# Patient Record
Sex: Female | Born: 1945 | Race: Black or African American | Hispanic: No | Marital: Single | Smoking: Current some day smoker
Health system: Southern US, Community
[De-identification: ages and names within clinical notes are randomized; demographics above are authoritative.]

## PROBLEM LIST (undated history)

## (undated) DIAGNOSIS — R569 Unspecified convulsions: Secondary | ICD-10-CM

## (undated) DIAGNOSIS — I10 Essential (primary) hypertension: Secondary | ICD-10-CM

---

## 2015-01-26 ENCOUNTER — Encounter (HOSPITAL_COMMUNITY): Payer: Self-pay | Admitting: Intensive Care

## 2015-01-26 ENCOUNTER — Inpatient Hospital Stay (HOSPITAL_COMMUNITY)
Admission: AD | Admit: 2015-01-26 | Discharge: 2015-02-01 | DRG: 101 | Disposition: A | Discharge status: Home Health | Payer: Medicare Other | Source: Other Acute Inpatient Hospital | Attending: Internal Medicine | Admitting: Internal Medicine

## 2015-01-26 DIAGNOSIS — R4182 Altered mental status, unspecified: Secondary | ICD-10-CM | POA: Diagnosis not present

## 2015-01-26 DIAGNOSIS — F17200 Nicotine dependence, unspecified, uncomplicated: Secondary | ICD-10-CM | POA: Diagnosis present

## 2015-01-26 DIAGNOSIS — F0390 Unspecified dementia without behavioral disturbance: Secondary | ICD-10-CM | POA: Diagnosis present

## 2015-01-26 DIAGNOSIS — F09 Unspecified mental disorder due to known physiological condition: Secondary | ICD-10-CM | POA: Diagnosis not present

## 2015-01-26 DIAGNOSIS — R9431 Abnormal electrocardiogram [ECG] [EKG]: Secondary | ICD-10-CM | POA: Diagnosis present

## 2015-01-26 DIAGNOSIS — R7989 Other specified abnormal findings of blood chemistry: Secondary | ICD-10-CM

## 2015-01-26 DIAGNOSIS — R410 Disorientation, unspecified: Secondary | ICD-10-CM | POA: Diagnosis present

## 2015-01-26 DIAGNOSIS — I10 Essential (primary) hypertension: Secondary | ICD-10-CM | POA: Diagnosis present

## 2015-01-26 DIAGNOSIS — G40909 Epilepsy, unspecified, not intractable, without status epilepticus: Principal | ICD-10-CM | POA: Diagnosis present

## 2015-01-26 DIAGNOSIS — R451 Restlessness and agitation: Secondary | ICD-10-CM | POA: Diagnosis not present

## 2015-01-26 DIAGNOSIS — R748 Abnormal levels of other serum enzymes: Secondary | ICD-10-CM | POA: Diagnosis present

## 2015-01-26 DIAGNOSIS — R569 Unspecified convulsions: Secondary | ICD-10-CM | POA: Diagnosis present

## 2015-01-26 DIAGNOSIS — F4322 Adjustment disorder with anxiety: Secondary | ICD-10-CM | POA: Diagnosis not present

## 2015-01-26 DIAGNOSIS — R079 Chest pain, unspecified: Secondary | ICD-10-CM | POA: Diagnosis not present

## 2015-01-26 DIAGNOSIS — R778 Other specified abnormalities of plasma proteins: Secondary | ICD-10-CM

## 2015-01-26 HISTORY — DX: Essential (primary) hypertension: I10

## 2015-01-26 HISTORY — DX: Unspecified convulsions: R56.9

## 2015-01-26 LAB — CBC
HCT: 29.6 % — ABNORMAL LOW (ref 36.0–46.0)
HEMOGLOBIN: 9.8 g/dL — AB (ref 12.0–15.0)
MCH: 31.5 pg (ref 26.0–34.0)
MCHC: 33.1 g/dL (ref 30.0–36.0)
MCV: 95.2 fL (ref 78.0–100.0)
Platelets: 154 10*3/uL (ref 150–400)
RBC: 3.11 MIL/uL — AB (ref 3.87–5.11)
RDW: 12.8 % (ref 11.5–15.5)
WBC: 8 10*3/uL (ref 4.0–10.5)

## 2015-01-26 LAB — TROPONIN I
TROPONIN I: 0.1 ng/mL — AB (ref <0.031)
TROPONIN I: 0.08 ng/mL — AB (ref <0.031)

## 2015-01-26 LAB — CREATININE, SERUM
Creatinine, Ser: 0.97 mg/dL (ref 0.44–1.00)
GFR calc non Af Amer: 57 mL/min — ABNORMAL LOW (ref >60)

## 2015-01-26 LAB — MRSA PCR SCREENING: MRSA by PCR: NEGATIVE

## 2015-01-26 MED ORDER — ACETAMINOPHEN 325 MG PO TABS: 650.0000 mg | ORAL_TABLET | Freq: Four times a day (QID) | ORAL | Status: DC | PRN

## 2015-01-26 MED ORDER — ENOXAPARIN SODIUM 40 MG/0.4ML ~~LOC~~ SOLN
40.0000 mg | SUBCUTANEOUS | Status: DC
  Administered 2015-01-26 – 2015-02-01 (×7): 40 mg via SUBCUTANEOUS
  Filled 2015-01-26 (×7): qty 0.4

## 2015-01-26 MED ORDER — LORAZEPAM 2 MG/ML IJ SOLN
INTRAMUSCULAR | Status: AC
  Administered 2015-01-26: 2 mg via INTRAVENOUS
  Filled 2015-01-26: qty 1

## 2015-01-26 MED ORDER — ACETAMINOPHEN 650 MG RE SUPP: 650.0000 mg | Freq: Four times a day (QID) | RECTAL | Status: DC | PRN

## 2015-01-26 MED ORDER — LORAZEPAM 2 MG/ML IJ SOLN
2.0000 mg | INTRAMUSCULAR | Status: DC | PRN
  Administered 2015-01-27: 2 mg via INTRAVENOUS
  Filled 2015-01-26: qty 1

## 2015-01-26 MED ORDER — ONDANSETRON HCL 4 MG PO TABS
4.0000 mg | ORAL_TABLET | Freq: Four times a day (QID) | ORAL | Status: DC | PRN
Start: 2015-01-26 — End: 2015-02-01

## 2015-01-26 MED ORDER — LORAZEPAM 2 MG/ML IJ SOLN
2.0000 mg | Freq: Once | INTRAMUSCULAR | Status: AC
  Administered 2015-01-26: 2 mg via INTRAVENOUS

## 2015-01-26 MED ORDER — ONDANSETRON HCL 4 MG/2ML IJ SOLN: 4.0000 mg | Freq: Four times a day (QID) | INTRAMUSCULAR | Status: DC | PRN

## 2015-01-26 MED ORDER — CARBAMAZEPINE 200 MG PO TABS
400.0000 mg | ORAL_TABLET | Freq: Three times a day (TID) | ORAL | Status: DC
  Administered 2015-01-26 – 2015-01-28 (×5): 400 mg via ORAL
  Filled 2015-01-26 (×12): qty 2

## 2015-01-26 NOTE — H&P (Signed)
[facility] History and Physical  [place] Searles WUJ:[id number] DOB: 03/22/1945 DOA: 01/26/2015  Referring physician: Emergency Department PCP: No primary care provider on file.   CHIEF COMPLAINT:   seizures  , elevated troponin   HPI: Gwenyth Dingee is a 69 y.o. female  With a history of HTN and seixures transferred from [facility] where patient presented last night with confusion and seizure activity. Limited history was able to be obtained. Troponin found to be elevated, patient transferred to [facility] for further evaluation. Upon arrival to stepdown patient had a seizure, she is now not responding to my questions but is moving around in bed. Supposedly on Carbamazepine at home       ED COURSE:     [facility] labs:  Normal Na, K+ and Ca+.  Glucose 90.  Mg 1.8. CK 1181, Troponin 0.09.  WBC 9.7, hgb 10.5 UDS + for Benzos.    Urinalysis:  Negative   EKG:  Non-specific mild ST changes  And twave inversion  Head CT scan WNL per records from [facility]                   Medications  LORazepam (ATIVAN) injection 2 mg (2 mg Intravenous Given 01/26/15 1042)    Review of Systems  Unable to perform ROS: medical condition    PMH: unable to verify with patient but HTN and seizures per records PSH: unable to verify  SOCIAL HISTORY:  has no tobacco, alcohol, and drug history on file. Lives: unknown   Assistive devices:   unknown   Allergies not on file Unable to verify with patient.   No family history on file. Unable to verify with patient  Prior to Admission medications   Not on File  Per [facility] ED notes she is on: .  Carbamazepine  BID  Macrodantin  BID  Zestoretic 20-12.5 daily    PHYSICAL EXAM: Filed Vitals:   01/26/15 1038 01/26/15 1040 01/26/15 1045 01/26/15 1100  BP:    112/50  Pulse: 120 103 90 82  Resp:  Height:      Weight:      SpO2: 97% 98% 98% 100%    Wt Readings from Last 3 Encounters:    01/26/15 50.4 kg (111 lb 1.8 oz)    General:  Thin black female, lethargic in bed Eyes: PER, normal lids, irises & conjunctiva ENT: grossly normal hearing, lips  Neck: no LAD, no masses Cardiovascular: RRR, no murmurs.  Respiratory: Respirations even and unlabored. Normal respiratory effort. Difficult to ascultate, she can't participate in exam. No obvious wheezes.   Abdomen: soft, non-distended, non-tender, active bowel sounds. No obvious masses.  Skin: no rash seen on limited exam Musculoskeletal: grossly normal tone BUE/BLE Psychiatric: grossly normal mood and affect, speech fluent and appropriate Neurologic: moves all extremities. Doesn't follow commands. Opens eyes briefly to sternal rub..          ASSESSMENT / PLAN   Seizures. Apparently has history of seizures and is maintained on Tegretol.  -Admit to stepdown -Head CTscan negative per report. Will give disc to Radiology to read.  -Neurology called- Dr .Roseanne Reno to see. Until seen by Neurology will give Ativan as needed. - Start Tegretol 400 milligrams by mouth 3 times a day -Tegretol level - there was question about whether patient was taking meds.  -seizure precautions  Elevated Troponin, probably related to seizure activity but she has Twave inversions on ECG. .  -Will ask Cardiology  to see -cycle Troponins.  Hypertension.  -Restart home Zestoretic when awake.   CONSULTANTS:    Neurology  Cardiology  Code Status: Full code per transfer papers DVT Prophylaxis: Lovenox Family Communication:  Will try to reach family member to discuss.  Disposition Plan: Discharge to home in 2-3 days   Time spent: 60 minutes Willette ClusterPaula Guenther  NP [facility] Pager [phone] I have taken an interval history, reviewed the chart and examined the patient. I agree with the Advanced Practice Provider's note, impression and recommendations. I have made any necessary editorial changes. 69 yr old female with history of seizures  brought to the hospital with seizures at home. Patient given Ativan as she had one seizure in the step down unit. Also has mild elevation of troponin. Started Ativan 2 mg IV every 4 hours when necessary. Also restart all medications Tegretol 400 mg by mouth 3 times a day. Obtain Tegretol level. Neurology and cardio  consultation.

## 2015-01-26 NOTE — Consult Note (Signed)
Admission H&P    Chief Complaint: Recurrent generalized seizure.  HPI: Victoria Hubbard is an 69 y.o. female with a history of seizure disorder on Tegretol seen in emergency room at [facility] for altered mental status with confusion. He reportedly had a generalized seizure as well. Troponin was noted to be elevated and patient was transferred to [facility] for further management. Tegretol level is ending. It is unclear and she last experienced seizure activity. She has been confused and agitated. She was given Versed early and is currently sedated.  Past Medical History  Diagnosis Date  . Seizures ([place])     No past surgical history on file.  Family history: Unavailable due to patient's mental status . Social History:  Unavailable due to patient's mental status.  Allergies: Allergies not on file  Medications: Patient reportedly was taking Tegretol for seizure control  ROS: Unavailable due to patient's mental status.  Physical Examination: Blood pressure 115/55, pulse 73, temperature 99 F (37.2 C), temperature source Axillary, resp. rate 15, height 5' 2"  (1.575 m), weight 50.4 kg (111 lb 1.8 oz), SpO2 100 %.  HEENT-  Normocephalic, no lesions, without obvious abnormality.  Normal external eye and conjunctiva.  Normal TM's bilaterally.  Normal auditory canals and external ears. Normal external nose, mucus membranes and septum.  Normal pharynx. Neck supple with no masses, nodes, nodules or enlargement. Cardiovascular - regular rate and rhythm, S1, S2 normal, no murmur, click, rub or gallop Lungs - chest clear, no wheezing, rales, normal symmetric air entry Abdomen - soft, non-tender; bowel sounds normal; no masses,  no organomegaly Extremities - no joint deformities, effusion, or inflammation and no edema  Neurologic Examination: Patient was markedly obtunded and agitated when stimulated. She appear to be in no acute distress. Pupils were equal and reacted normally to  light. Extraocular movements were normal and conjugate with oculocephalic maneuvers. Face was symmetrical with no focal weakness. Muscle tone was flaccid throughout. Withdrawal movements to noxious stimuli were equal and symmetrical. Deep tendon reflexes were 2+ and symmetrical. Plantar responses were flexor bilaterally.  Results for orders placed or performed during the hospital encounter of 01/26/15 (from the past 48 hour(s))  MRSA PCR Screening     Status: None   Collection Time: 01/26/15 10:40 AM  Result Value Ref Range   MRSA by PCR NEGATIVE NEGATIVE    Comment:        The GeneXpert MRSA Assay (FDA approved for NASAL specimens only), is one component of a comprehensive MRSA colonization surveillance program. It is not intended to diagnose MRSA infection nor to guide or monitor treatment for MRSA infections.   CBC     Status: Abnormal   Collection Time: 01/26/15 12:39 PM  Result Value Ref Range   WBC 8.0 4.0 - 10.5 K/uL   RBC 3.11 (L) 3.87 - 5.11 MIL/uL   Hemoglobin 9.8 (L) 12.0 - 15.0 g/dL   HCT 29.6 (L) 36.0 - 46.0 %   MCV 95.2 78.0 - 100.0 fL   MCH 31.5 26.0 - 34.0 pg   MCHC 33.1 30.0 - 36.0 g/dL   RDW 12.8 11.5 - 15.5 %   Platelets 154 150 - 400 K/uL  Creatinine, serum     Status: Abnormal   Collection Time: 01/26/15 12:39 PM  Result Value Ref Range   Creatinine, Ser 0.97 0.44 - 1.00 mg/dL   GFR calc non Af Amer 57 (L) >60 mL/min   GFR calc Af Amer >60 >60 mL/min    Comment: (NOTE)  The eGFR has been calculated using the CKD EPI equation. This calculation has not been validated in all clinical situations. eGFR's persistently <60 mL/min signify possible Chronic Kidney Disease.   Troponin I     Status: Abnormal   Collection Time: 01/26/15 12:39 PM  Result Value Ref Range   Troponin I 0.10 (H) <0.031 ng/mL    Comment:        PERSISTENTLY INCREASED TROPONIN VALUES IN THE RANGE OF 0.04-0.49 ng/mL CAN BE SEEN IN:       -UNSTABLE ANGINA       -CONGESTIVE HEART  FAILURE       -MYOCARDITIS       -CHEST TRAUMA       -ARRYHTHMIAS       -LATE PRESENTING MYOCARDIAL INFARCTION       -COPD   CLINICAL FOLLOW-UP RECOMMENDED.    No results found.  Assessment/Plan Evident 69-year-old lady with a history of seizure disorder treated with Tegretol presenting with current generalized seizure.  Recommendations: 1. Presenting Tegretol at 400 mg 3 times a day. 2. MRI of the brain to rule out possible acute stroke. 3. No indication for EEG study.  We will continue to follow this patient with you.  C.R. Nicole Kindred, MD [facility] [phone]  01/26/2015, 3:14 PM

## 2015-01-26 NOTE — Progress Notes (Signed)
Transfer from [facility] per EDP, Dr. Darius BumpMckinless  69 year old lady is past medical history of seizure on carbamazepine, who presents with the altered mental status with unknow starting time. No focal neurologic findings. Patient was found to have elevated troponin 0.09, CK-MB 7.9-->9.3. Patient does not have chest pain, shortness of breath, fever or chills per EDP. Patient is agitated in the emergency room and treated with Versed. CT head is negative, chest x-ray negative, RBC 9.7, hemoglobin 10.3, temperature 37.2, blood pressure 126/86, heart rate 78, RR 18, electrolytes and renal function normal. EKG showed QTC 433, T wave flattening in lead 1 and aVL. Due to lack of cardiology coverage, patient is transferred to us (card not called yet). One dose of ASA was given.   Lorretta HarpXilin Syesha Thaw, MD  [facility] Pager [phone]  If 7PM-7AM, please contact night-coverage [email or web address] Password [email or web address] 01/26/2015, 6:29 AM

## 2015-01-26 NOTE — Consult Note (Signed)
CARDIOLOGY CONSULT NOTE  Patient ID: Victoria FarberJudy Hubbard MRN: [id number] DOB/AGE: 1945/09/16 69 y.o.  Admit date: 01/26/2015 Primary Physician Unknown Primary Cardiologist Unknown Chief Complaint  Elevated troponin.   HPI:  The patient was transferred from [facility] after being admitted there for seizures.  She was noted to have an elevated troponin.   Since being admitted she has had recurrent seizures.   She is now post ictal and status post Versed.  She is unable to provide any history.   She has no family contacts.  There is nobody here with her to provide a history.  There are minimal records from the transferring hospital.   No records in Care Everywhere.  She reported a history of seizures.  There was some question of whether she was taking her medications.     Past Medical History  Diagnosis Date  . Seizures (HCC)     Past Surgical Hysterectomy   Allergies:  Outside hospital reports no allergies but we cannot confirm from the patient.   Meds:  Carbatrol 600 mg po BID,  Zestoretic 20/12.5 BID, Macrodantin 100 mg BID  No family history on file.  Social History   Social History  . Marital Status: Single    Spouse Name: N/A  . Number of Children: N/A  . Years of Education: N/A   Occupational History  . Not on file.   Social History Main Topics  . Smoking status: Not on file  . Smokeless tobacco: Not on file  . Alcohol Use: Not on file  . Drug Use: Not on file  . Sexual Activity: Not on file   Other Topics Concern  . Not on file   Social History Narrative  . No narrative on file     ROS:   Unable to obtain from the patient as she is not responding to questions.   Physical Exam: Blood pressure 118/59, pulse 77, temperature 99 F (37.2 C), temperature source Axillary, resp. rate 15, height 5\' 2"  (1.575 m), weight 111 lb 1.8 oz (50.4 kg), SpO2 97 %.  GENERAL:  Thin, and in no distress NECK:  No jugular venous distention, waveform within normal  limits, carotid upstroke brisk and symmetric, no bruits, no thyromegaly LYMPHATICS:  No cervical, inguinal adenopathy LUNGS:  Clear to auscultation bilaterally BACK:  No CVA tenderness CHEST:  Unremarkable HEART:  PMI not displaced or sustained,S1 and S2 within normal limits, no S3, no S4, no clicks, no rubs, no murmurs ABD:  Flat, positive bowel sounds normal in frequency in pitch, no bruits, no rebound, no guarding, no midline pulsatile mass, no hepatomegaly, no splenomegaly EXT:  2 plus pulses throughout, no edema, no cyanosis no clubbing SKIN:  No rashes no nodules NEURO:  Moves all extremities.  PSYCH:   Unable to assess   Labs: Troponin:  .09,   CK 1229, MB 9.3, Na 142, K 4, BUN 21, creat 1.1, WBC 9.7,  Hgb 10.5,      EKG:  NSR, rate 78, axis WNL, intervals WNL, no acute ST T wave changes.  RSR V1 and V2.  No acute ST T wave changes.   ASSESSMENT AND PLAN:   Elevated troponin:  No evidence of an acute cardiac event.  Single mildly elevated troponin is non specific.  Cycle enzymes.  We will need to question her when able to see if she has any past cardiac history or recent cardiac symptoms.  No urgent work up indicated.  No indication for heparin.  Outside  records reviewed.     SignedRollene Rotunda 01/26/2015, 12:26 PM

## 2015-01-26 NOTE — Progress Notes (Signed)
01/26/2015 1100 Nursing note Seizure pads placed per protocol.  Morrill Bomkamp, Blanchard KelchStephanie Ingold

## 2015-01-26 NOTE — Progress Notes (Signed)
01/26/2015 1038 Pt. Noted to be actively seizing at this time. Airway preserved, o2 sats  >90%. Placed on 2L [place]. Suction at bedside.  Admitting provider paged and made aware. Dr. Craige CottaSood with CCM on floor and verbal orders received for 2MG  Ativan IV x1. Orders enacted. Seizure activity lasted approximately 2 minutes. Postictally, pt. Responding to voice, vital signs stable. Will continue to closely monitor patient.  Victoria Hubbard, Blanchard KelchStephanie Hubbard

## 2015-01-27 LAB — CBC
HCT: 34 % — ABNORMAL LOW (ref 36.0–46.0)
Hemoglobin: 11 g/dL — ABNORMAL LOW (ref 12.0–15.0)
MCH: 30.8 pg (ref 26.0–34.0)
MCHC: 32.4 g/dL (ref 30.0–36.0)
MCV: 95.2 fL (ref 78.0–100.0)
PLATELETS: 176 10*3/uL (ref 150–400)
RBC: 3.57 MIL/uL — ABNORMAL LOW (ref 3.87–5.11)
RDW: 12.7 % (ref 11.5–15.5)
WBC: 7.6 10*3/uL (ref 4.0–10.5)

## 2015-01-27 LAB — COMPREHENSIVE METABOLIC PANEL
ALT: 18 U/L (ref 14–54)
ANION GAP: 9 (ref 5–15)
AST: 42 U/L — ABNORMAL HIGH (ref 15–41)
Albumin: 3 g/dL — ABNORMAL LOW (ref 3.5–5.0)
Alkaline Phosphatase: 70 U/L (ref 38–126)
BUN: 15 mg/dL (ref 6–20)
CHLORIDE: 111 mmol/L (ref 101–111)
CO2: 22 mmol/L (ref 22–32)
CREATININE: 1.1 mg/dL — AB (ref 0.44–1.00)
Calcium: 9.9 mg/dL (ref 8.9–10.3)
GFR, EST AFRICAN AMERICAN: 57 mL/min — AB (ref >60)
GFR, EST NON AFRICAN AMERICAN: 49 mL/min — AB (ref >60)
Glucose, Bld: 61 mg/dL — ABNORMAL LOW (ref 65–99)
POTASSIUM: 3.9 mmol/L (ref 3.5–5.1)
Sodium: 142 mmol/L (ref 135–145)
Total Bilirubin: 0.7 mg/dL (ref 0.3–1.2)
Total Protein: 6.1 g/dL — ABNORMAL LOW (ref 6.5–8.1)

## 2015-01-27 LAB — CK: CK TOTAL: 1447 U/L — AB (ref 38–234)

## 2015-01-27 LAB — TROPONIN I: Troponin I: 0.07 ng/mL — ABNORMAL HIGH (ref <0.031)

## 2015-01-27 NOTE — Progress Notes (Signed)
PROGRESS NOTE  Victoria Hubbard ZOX:[id number] DOB: 05-Sep-1945 DOA: 01/26/2015 PCP: No primary care provider on file.  Assessment/Plan: Seizures. Apparently has history of seizures and is maintained on Tegretol.  -Head CT scan negative per report -Neurology following: Tegretol 400 milligrams by mouth 3 times a day -Tegretol level - there was question about whether patient was taking meds? -seizure precautions  Elevated Troponin, probably related to seizure activity but she has T wave inversions on ECG. .  -no cardiology intervention -cycle Troponins.  Hypertension.  -Restart home Zestoretic when awake.   Called # listed and was not a valid number -also called PCP to get baseline-- patient is brought to appointments by "friend/neighbor", no family that PCP is aware of.  Patient is soft spoken and is "mentally challenged"-- social work consult PT eval ordered  Code Status: full Family Communication: patient Disposition Plan:    Consultants:  Neuro  cards  Procedures:     HPI/Subjective: No SOB, no CP  Objective: Filed Vitals:   01/27/15 0318 01/27/15 0730  BP: 144/59   Pulse: 76   Temp: 98.4 F (36.9 C) 98.7 F (37.1 C)  Resp: 14     Intake/Output Summary (Last 24 hours) at 01/27/15 1237 Last data filed at 01/27/15 0400  Gross per 24 hour  Intake      0 ml  Output    500 ml  Net   -500 ml   Filed Weights   01/26/15 1023  Weight: 50.4 kg (111 lb 1.8 oz)    Exam:   General:  Awake, soft spoken  Cardiovascular: rrr  Respiratory: clear  Abdomen: +BS, soft  Musculoskeletal: no edema   Data Reviewed: Basic Metabolic Panel:  Recent Labs Lab 01/26/15 1239 01/27/15 0645  NA  --  142  K  --  3.9  CL  --  111  CO2  --  22  GLUCOSE  --  61*  BUN  --  15  CREATININE 0.97 1.10*  CALCIUM  --  9.9   Liver Function Tests:  Recent Labs Lab 01/27/15 0645  AST 42*  ALT 18  ALKPHOS 70  BILITOT 0.7  PROT 6.1*  ALBUMIN 3.0*   No results  for input(s): LIPASE, AMYLASE in the last 168 hours. No results for input(s): AMMONIA in the last 168 hours. CBC:  Recent Labs Lab 01/26/15 1239 01/27/15 0645  WBC 8.0 7.6  HGB 9.8* 11.0*  HCT 29.6* 34.0*  MCV 95.2 95.2  PLT 154 176   Cardiac Enzymes:  Recent Labs Lab 01/26/15 1239 01/26/15 1916 01/27/15  TROPONINI 0.10* 0.08* 0.07*   BNP (last 3 results) No results for input(s): BNP in the last 8760 hours.  ProBNP (last 3 results) No results for input(s): PROBNP in the last 8760 hours.  CBG: No results for input(s): GLUCAP in the last 168 hours.  Recent Results (from the past 240 hour(s))  MRSA PCR Screening     Status: None   Collection Time: 01/26/15 10:40 AM  Result Value Ref Range Status   MRSA by PCR NEGATIVE NEGATIVE Final    Comment:        The GeneXpert MRSA Assay (FDA approved for NASAL specimens only), is one component of a comprehensive MRSA colonization surveillance program. It is not intended to diagnose MRSA infection nor to guide or monitor treatment for MRSA infections.      Studies: No results found.  Scheduled Meds: . carbamazepine  400 mg Oral TID  . enoxaparin (LOVENOX) injection  40 mg Subcutaneous Q24H   Continuous Infusions:  Antibiotics Given (last 72 hours)    None      Principal Problem:   Seizures (HCC) Active Problems:   Essential hypertension   Nonspecific abnormal electrocardiogram (ECG) (EKG)   Elevated troponin level    Time spent: 25 min    Carnisha Feltz [facility]  [facility] Pager [phone]. If 7PM-7AM, please contact night-coverage at [email or web address], password [facility] 01/27/2015, 12:37 PM  LOS: 1 day

## 2015-01-27 NOTE — Progress Notes (Signed)
Pt continues to pull herself off of the monitor. VSS. Monitor removed to assist decrease in patient restlessness. RN will continue to monitor,

## 2015-01-27 NOTE — Evaluation (Signed)
Clinical/Bedside Swallow Evaluation Patient Details  Name: Mickey FarberJudy Streicher MRN: [id number] Date of Birth: [birth year]  Today's Date: 01/27/2015 Time: SLP Start Time (ACUTE ONLY): 1036 SLP Stop Time (ACUTE ONLY): 1049 SLP Time Calculation (min) (ACUTE ONLY): 13 min  Past Medical History:  Past Medical History  Diagnosis Date  . Seizures (HCC)    Past Surgical History: No past surgical history on file. HPI:  69 year old female transferred from outside hospital with confusion and seizure activity. PMH of seizures, HTN. Per Notes, CT of the head negative. MRI pending to r/o CVA.    Assessment / Plan / Recommendation Clinical Impression  Patient presents with a functional oropharyngeal swallow without observed evidence of dysphagia or aspiration. No SLP f/u indicated at this time.     Aspiration Risk  Mild aspiration risk    Diet Recommendation Regular;Thin liquid   Liquid Administration via: Cup;Straw Medication Administration: Whole meds with liquid Supervision: Patient able to self feed Compensations: Slow rate;Small sips/bites Postural Changes: Seated upright at 90 degrees    Other  Recommendations Oral Care Recommendations: Oral care BID   Follow up Recommendations  None               Swallow Study   General HPI: 69 year old female transferred from outside hospital with confusion and seizure activity. PMH of seizures, HTN. Per Notes, CT of the head negative. MRI pending to r/o CVA.  Type of Study: Bedside Swallow Evaluation Previous Swallow Assessment: none noted Diet Prior to this Study: NPO Temperature Spikes Noted: No Respiratory Status: Room air History of Recent Intubation: No Behavior/Cognition: Alert;Cooperative;Pleasant mood Oral Cavity Assessment: Within Functional Limits Oral Care Completed by SLP: No Oral Cavity - Dentition: Missing dentition Vision: Functional for self-feeding Self-Feeding Abilities: Able to feed self Patient Positioning: Upright in  bed Baseline Vocal Quality: Low vocal intensity (mumbles) Volitional Cough: Strong Volitional Swallow: Able to elicit    Oral/Motor/Sensory Function Overall Oral Motor/Sensory Function: Mild impairment Facial ROM: Within Functional Limits Facial Symmetry: Within Functional Limits Facial Strength: Within Functional Limits Facial Sensation: Within Functional Limits Lingual ROM: Within Functional Limits Lingual Symmetry: Abnormal symmetry right (subtle deviation to the right) Lingual Strength: Within Functional Limits Lingual Sensation: Within Functional Limits Velum: Within Functional Limits Mandible: Within Functional Limits   Ice Chips Ice chips: Not tested   Thin Liquid Thin Liquid: Within functional limits Presentation: Cup;Self Fed;Straw    Nectar Thick Nectar Thick Liquid: Not tested   Honey Thick Honey Thick Liquid: Not tested   Puree Puree: Within functional limits Presentation: Self Fed;Spoon   Solid   GO    Solid: Within functional limits Presentation: Self Fed      Amylee Lodato MA, CCC-SLP [phone]  Rajeev Escue Meryl 01/27/2015,10:50 AM

## 2015-01-27 NOTE — Progress Notes (Signed)
Subjective: Patient is alert and has not experienced recurrent seizure. She has remained confused and agitated. Tegretol level from 01/26/2015 still pending.  Objective: Current vital signs: BP 144/59 mmHg  Pulse 76  Temp(Src) 98.7 F (37.1 C) (Oral)  Resp 14  Ht 5\' 2"  (1.575 m)  Wt 50.4 kg (111 lb 1.8 oz)  BMI 20.32 kg/m2  SpO2 99%  Neurologic Exam: Patient was alert and disoriented to time as well as place. Extraocular movements were full and conjugate. Face was symmetrical. Speech was normal, without dysarthria. Patient moved extremities equally with good strength throughout.  Medications: I have reviewed the patient's current medications.  Assessment/Plan: 69 year old lady admitted with altered mental status as well as generalized seizure, along with chest pain. She has not had a recurrence of seizures. She's currently taking Tegretol 400 mg 3 times a day. She remains confused. It's unclear at this point for her baseline cognitive functioning is like. Ex  Recommend no changes in current management. We will continue to follow this patient with you.  C.R. Roseanne RenoStewart, MD [facility] [phone]  01/27/2015  10:45 AM

## 2015-01-28 ENCOUNTER — Encounter (HOSPITAL_COMMUNITY): Payer: Self-pay | Admitting: *Deleted

## 2015-01-28 DIAGNOSIS — R4182 Altered mental status, unspecified: Secondary | ICD-10-CM | POA: Insufficient documentation

## 2015-01-28 LAB — BASIC METABOLIC PANEL
Anion gap: 10 (ref 5–15)
BUN: 10 mg/dL (ref 6–20)
CHLORIDE: 109 mmol/L (ref 101–111)
CO2: 23 mmol/L (ref 22–32)
CREATININE: 0.9 mg/dL (ref 0.44–1.00)
Calcium: 9.6 mg/dL (ref 8.9–10.3)
GFR calc Af Amer: >60 mL/min (ref >60)
GFR calc non Af Amer: >60 mL/min (ref >60)
Glucose, Bld: 100 mg/dL — ABNORMAL HIGH (ref 65–99)
POTASSIUM: 3.6 mmol/L (ref 3.5–5.1)
Sodium: 142 mmol/L (ref 135–145)

## 2015-01-28 LAB — CARBAMAZEPINE LEVEL, TOTAL: Carbamazepine Lvl: 14.5 ug/mL — ABNORMAL HIGH (ref 4.0–12.0)

## 2015-01-28 LAB — CBC
HEMATOCRIT: 32 % — AB (ref 36.0–46.0)
Hemoglobin: 10.5 g/dL — ABNORMAL LOW (ref 12.0–15.0)
MCH: 30.8 pg (ref 26.0–34.0)
MCHC: 32.8 g/dL (ref 30.0–36.0)
MCV: 93.8 fL (ref 78.0–100.0)
Platelets: 166 10*3/uL (ref 150–400)
RBC: 3.41 MIL/uL — ABNORMAL LOW (ref 3.87–5.11)
RDW: 12.5 % (ref 11.5–15.5)
WBC: 7.2 10*3/uL (ref 4.0–10.5)

## 2015-01-28 MED ORDER — SODIUM CHLORIDE 0.9 % IV SOLN
INTRAVENOUS | Status: AC
  Administered 2015-01-28: 09:00:00 via INTRAVENOUS

## 2015-01-28 MED ORDER — CARBAMAZEPINE 200 MG PO TABS
600.0000 mg | ORAL_TABLET | Freq: Every day | ORAL | Status: DC
  Administered 2015-01-29 – 2015-01-31 (×3): 600 mg via ORAL
  Filled 2015-01-28 (×3): qty 3

## 2015-01-28 MED ORDER — CARBAMAZEPINE 200 MG PO TABS
400.0000 mg | ORAL_TABLET | ORAL | Status: DC
  Administered 2015-01-29 – 2015-02-01 (×4): 400 mg via ORAL
  Filled 2015-01-28 (×6): qty 2

## 2015-01-28 MED ORDER — LEVETIRACETAM 500 MG PO TABS: 500.0000 mg | ORAL_TABLET | Freq: Two times a day (BID) | ORAL | Status: DC

## 2015-01-28 MED ORDER — QUETIAPINE FUMARATE 25 MG PO TABS
25.0000 mg | ORAL_TABLET | Freq: Every day | ORAL | Status: DC
  Administered 2015-01-28 – 2015-01-31 (×4): 25 mg via ORAL
  Filled 2015-01-28 (×4): qty 1

## 2015-01-28 MED ORDER — LISINOPRIL 5 MG PO TABS
5.0000 mg | ORAL_TABLET | Freq: Every day | ORAL | Status: DC
  Administered 2015-01-28 – 2015-02-01 (×5): 5 mg via ORAL
  Filled 2015-01-28 (×5): qty 1

## 2015-01-28 NOTE — Progress Notes (Signed)
PROGRESS NOTE  Victoria Hubbard QMV:[id number] DOB: 11-04-1945 DOA: 01/26/2015 PCP: No primary care provider on file.  Victoria Hubbard is a 70 y.o. female With a history of HTN and seixures transferred from [facility] where patient presented last night with confusion and seizure activity. Limited history was able to be obtained. Troponin found to be elevated, patient [facility] for [facility] evaluation.  Supposedly on Carbamazepine at home.  Patient lives home alone and is brought to her doctor by neighbors-- no family that her PCP is aware of.     Assessment/Plan: Seizures. Apparently has history of seizures and is maintained on Tegretol.  -Head CT scan negative per report -Neurology following: Tegretol 400 milligrams by mouth 3 times a day -Tegretol level - there was question about whether patient was taking meds? -seizure precautions  Elevated Troponin, probably related to seizure activity but she has T wave inversions on ECG. .  -no cardiology intervention -cycle Troponins.  Hypertension.  -Restart home Zestoretic when awake.   Called # listed and was not a valid number -also called PCP to get baseline-- patient is brought to appointments by "friend/neighbor", no family that PCP is aware of.  Patient is soft spoken and is "mentally challenged"-- social work consult  PT eval ordered  -requiring sitter as impulsive and unsteady   Code Status: full Family Communication: patient Disposition Plan:    Consultants:  Neuro  cards  Procedures:     HPI/Subjective: Up in chair-- up all night getting out of bed  Objective: Filed Vitals:   01/27/15 2341 01/28/15 0406  BP: 180/81 153/64  Pulse: 101 76  Temp: 98.3 F (36.8 C) 98.3 F (36.8 C)  Resp: 20 16    Intake/Output Summary (Last 24 hours) at 01/28/15 0840 Last data filed at 01/28/15 0000  Gross per 24 hour  Intake      0 ml  Output    350 ml  Net   -350 ml   Filed Weights   01/26/15 1023    Weight: 50.4 kg (111 lb 1.8 oz)    Exam:   General:  Awake, soft spoken  Cardiovascular: rrr  Respiratory: clear  Abdomen: +BS, soft  Musculoskeletal: no edema   Data Reviewed: Basic Metabolic Panel:  Recent Labs Lab 01/26/15 1239 01/27/15 0645 01/28/15 0444  NA  --  142 142  K  --  3.9 3.6  CL  --  111 109  CO2  --  22 23  GLUCOSE  --  61* 100*  BUN  --  15 10  CREATININE 0.97 1.10* 0.90  CALCIUM  --  9.9 9.6   Liver Function Tests:  Recent Labs Lab 01/27/15 0645  AST 42*  ALT 18  ALKPHOS 70  BILITOT 0.7  PROT 6.1*  ALBUMIN 3.0*   No results for input(s): LIPASE, AMYLASE in the last 168 hours. No results for input(s): AMMONIA in the last 168 hours. CBC:  Recent Labs Lab 01/26/15 1239 01/27/15 0645 01/28/15 0444  WBC 8.0 7.6 7.2  HGB 9.8* 11.0* 10.5*  HCT 29.6* 34.0* 32.0*  MCV 95.2 95.2 93.8  PLT 154 176 166   Cardiac Enzymes:  Recent Labs Lab 01/26/15 1239 01/26/15 1916 01/27/15 01/27/15 0930  CKTOTAL  --   --   --  1447*  TROPONINI 0.10* 0.08* 0.07*  --    BNP (last 3 results) No results for input(s): BNP in the last 8760 hours.  ProBNP (last 3 results) No results for input(s): PROBNP  in the last 8760 hours.  CBG: No results for input(s): GLUCAP in the last 168 hours.  Recent Results (from the past 240 hour(s))  MRSA PCR Screening     Status: None   Collection Time: 01/26/15 10:40 AM  Result Value Ref Range Status   MRSA by PCR NEGATIVE NEGATIVE Final    Comment:        The GeneXpert MRSA Assay (FDA approved for NASAL specimens only), is one component of a comprehensive MRSA colonization surveillance program. It is not intended to diagnose MRSA infection nor to guide or monitor treatment for MRSA infections.      Studies: No results found.  Scheduled Meds: . carbamazepine  400 mg Oral TID  . enoxaparin (LOVENOX) injection  40 mg Subcutaneous Q24H  . lisinopril  5 mg Oral Daily  . QUEtiapine  25 mg Oral QHS    Continuous Infusions:  Antibiotics Given (last 72 hours)    None      Principal Problem:   Seizures (HCC) Active Problems:   Essential hypertension   Nonspecific abnormal electrocardiogram (ECG) (EKG)   Elevated troponin level    Time spent: 25 min    JESSICA U [facility]  [facility] Pager [phone]. If 7PM-7AM, please contact night-coverage at [email or web address], password [facility] 01/28/2015, 8:40 AM  LOS: 2 days

## 2015-01-28 NOTE — Progress Notes (Addendum)
Subjective: Patient said no recurrence of seizure activity. She is less agitated today compared to yesterday, and is not requiring a sitter.  Objective: Current vital signs: BP 153/64 mmHg  Pulse 76  Temp(Src) 98.3 F (36.8 C) (Axillary)  Resp 16  Ht 5\' 2"  (1.575 m)  Wt 50.4 kg (111 lb 1.8 oz)  BMI 20.32 kg/m2  SpO2 100%  Neurologic Exam: Patient was sleeping but easy to arouse. She was oriented to time. Speech was commensurate with level of alertness with mild dysarthria. Extraocular movements were full and conjugate. Face was symmetrical. Patient moved extremities well and equally with no signs of focal weakness. Deep tendon reflexes were 2+ and symmetrical.  Medications: I have reviewed the patient's current medications.  Assessment/Plan: 70 year old lady minute with altered mental status going a recurrent generalized seizure in history of seizure disorder treated with Tegretol. Tegretol level was obtained on admission but results are still pending. She is currently on Tegretol 400 mg 3 times a day and was started on Keppra 500 mg twice a day on this admission. There's been tolerating this medicine well.  Recommend no changes in her management. I will order a repeat Tegretol level today and have it processed stat.  We will continue to follow this patient with you.  C.R. Roseanne RenoStewart, MD [facility] [phone]  01/28/2015  9:00 AM  Addendum: Patient has not been receiving extra been treated with Tegretol only at 400 mg 3 times a day. Tegretol level obtained today was 14.5. Level was taken after her morning dose of Tegretol.  I will reduce Tegretol dose to 400 mg in the morning and some 600mg  at bedtime, starting on 01/28/2014.  CR Margaretha GlassingStewart M.D.

## 2015-01-28 NOTE — Progress Notes (Signed)
    SUBJECTIVE:  No distress.  Confused but able to answer questions.  She denies any SOB or history of chest pain.  She denies any prior cardiac history   PHYSICAL EXAM Filed Vitals:   01/27/15 1926 01/27/15 2341 01/28/15 0406 01/28/15 0726  BP: 161/66 180/81 153/64 172/86  Pulse: 100 101 76   Temp: 98.3 F (36.8 C) 98.3 F (36.8 C) 98.3 F (36.8 C)   TempSrc: Axillary Axillary Axillary   Resp: 19 20 16    Height:      Weight:      SpO2: 99% 99% 100% 100%   General:  No distress Lungs:  Clear Heart:  RRR Abdomen:  Positive bowel sounds, no rebound no guarding Extremities:  No edema  LABS: Lab Results  Component Value Date   TROPONINI 0.07* 01/27/2015   Results for orders placed or performed during the hospital encounter of 01/26/15 (from the past 24 hour(s))  CBC     Status: Abnormal   Collection Time: 01/28/15  4:44 AM  Result Value Ref Range   WBC 7.2 4.0 - 10.5 K/uL   RBC 3.41 (L) 3.87 - 5.11 MIL/uL   Hemoglobin 10.5 (L) 12.0 - 15.0 g/dL   HCT 19.132.0 (L) 47.836.0 - 29.546.0 %   MCV 93.8 78.0 - 100.0 fL   MCH 30.8 26.0 - 34.0 pg   MCHC 32.8 30.0 - 36.0 g/dL   RDW 62.112.5 30.811.5 - 65.715.5 %   Platelets 166 150 - 400 K/uL  Basic metabolic panel     Status: Abnormal   Collection Time: 01/28/15  4:44 AM  Result Value Ref Range   Sodium 142 135 - 145 mmol/L   Potassium 3.6 3.5 - 5.1 mmol/L   Chloride 109 101 - 111 mmol/L   CO2 23 22 - 32 mmol/L   Glucose, Bld 100 (H) 65 - 99 mg/dL   BUN 10 6 - 20 mg/dL   Creatinine, Ser 8.460.90 0.44 - 1.00 mg/dL   Calcium 9.6 8.9 - 96.210.3 mg/dL   GFR calc non Af Amer >60 >60 mL/min   GFR calc Af Amer >60 >60 mL/min   Anion gap 10 5 - 15    Intake/Output Summary (Last 24 hours) at 01/28/15 1046 Last data filed at 01/28/15 1000  Gross per 24 hour  Intake    480 ml  Output    350 ml  Net    130 ml    EKG:    ASSESSMENT AND PLAN:  ELEVATED TROPONIN:   Non specific.  Repeat EKG.  Check echo.  Doubt any further cardiac work up will be  indicated if EF and echo OK.    Fayrene FearingJames Rex [facility] 01/28/2015 10:46 AM

## 2015-01-29 ENCOUNTER — Inpatient Hospital Stay (HOSPITAL_COMMUNITY): Payer: Medicare Other

## 2015-01-29 DIAGNOSIS — R410 Disorientation, unspecified: Secondary | ICD-10-CM

## 2015-01-29 DIAGNOSIS — R451 Restlessness and agitation: Secondary | ICD-10-CM | POA: Insufficient documentation

## 2015-01-29 DIAGNOSIS — R569 Unspecified convulsions: Secondary | ICD-10-CM

## 2015-01-29 DIAGNOSIS — R079 Chest pain, unspecified: Secondary | ICD-10-CM

## 2015-01-29 LAB — CBC
HCT: 33.1 % — ABNORMAL LOW (ref 36.0–46.0)
HEMOGLOBIN: 10.9 g/dL — AB (ref 12.0–15.0)
MCH: 31.1 pg (ref 26.0–34.0)
MCHC: 32.9 g/dL (ref 30.0–36.0)
MCV: 94.3 fL (ref 78.0–100.0)
PLATELETS: 161 10*3/uL (ref 150–400)
RBC: 3.51 MIL/uL — AB (ref 3.87–5.11)
RDW: 12.7 % (ref 11.5–15.5)
WBC: 4.2 10*3/uL (ref 4.0–10.5)

## 2015-01-29 LAB — BASIC METABOLIC PANEL
ANION GAP: 12 (ref 5–15)
BUN: 10 mg/dL (ref 6–20)
CHLORIDE: 103 mmol/L (ref 101–111)
CO2: 26 mmol/L (ref 22–32)
Calcium: 9.6 mg/dL (ref 8.9–10.3)
Creatinine, Ser: 0.92 mg/dL (ref 0.44–1.00)
GFR calc Af Amer: >60 mL/min (ref >60)
Glucose, Bld: 95 mg/dL (ref 65–99)
POTASSIUM: 3.7 mmol/L (ref 3.5–5.1)
SODIUM: 141 mmol/L (ref 135–145)

## 2015-01-29 LAB — CARBAMAZEPINE LEVEL, TOTAL: Carbamazepine Lvl: 5.9 ug/mL (ref 4.0–12.0)

## 2015-01-29 LAB — CK: CK TOTAL: 867 U/L — AB (ref 38–234)

## 2015-01-29 MED ORDER — QUETIAPINE FUMARATE 25 MG PO TABS
12.5000 mg | ORAL_TABLET | Freq: Two times a day (BID) | ORAL | Status: DC
  Administered 2015-01-30 – 2015-02-01 (×6): 12.5 mg via ORAL
  Filled 2015-01-29 (×6): qty 1

## 2015-01-29 MED ORDER — HALOPERIDOL LACTATE 5 MG/ML IJ SOLN
2.0000 mg | Freq: Four times a day (QID) | INTRAMUSCULAR | Status: DC | PRN
  Administered 2015-01-29: 2 mg via INTRAVENOUS
  Filled 2015-01-29: qty 1

## 2015-01-29 NOTE — Progress Notes (Signed)
PT Cancellation Note  Patient Details Name: Victoria Hubbard MRN: [id number] DOB: 1946/01/20   Cancelled Treatment:    Reason Eval/Treat Not Completed: Patient at procedure or test/unavailable. Pt off of the unit.  Will check back.   Antavious Spanos LUBECK 01/29/2015, 9:34 AM

## 2015-01-29 NOTE — Evaluation (Signed)
Occupational Therapy Evaluation Patient Details Name: Victoria Hubbard MRN: [id number] DOB: 02/15/1945 Today's Date: 01/29/2015    History of Present Illness Victoria Hubbard is an 70 y.o. female hx of seizures admitted with AMS   Clinical Impression   Patient presenting with decreased ADL and functional mobility independence secondary to above. Patient independent PTA. Patient currently functioning at an overall min assist level. Patient will benefit from acute OT to increase overall independence in the areas of ADLs, functional mobility, and overall safety in order to safely discharge to venue listed below.     Follow Up Recommendations  SNF;Supervision/Assistance - 24 hour    Equipment Recommendations  Other (comment) (TBD)    Recommendations for Other Services  None at this time   Precautions / Restrictions Precautions Precautions: Fall Restrictions Weight Bearing Restrictions: No    Mobility Bed Mobility Overal bed mobility: Independent  Transfers Overall transfer level: Needs assistance Equipment used: None Transfers: Sit to/from Stand Sit to Stand: Min guard         General transfer comment: min/guard for steadying    Balance Overall balance assessment: Needs assistance;History of Falls (Pt stated she falls, but unable to get any more info on the falls) Sitting-balance support: No upper extremity supported;Feet supported Sitting balance-Leahy Scale: Good     Standing balance support: No upper extremity supported;During functional activity Standing balance-Leahy Scale: Fair Standing balance comment: min/guard for steadying    ADL Overall ADL's : Needs assistance/impaired Eating/Feeding: Set up;Sitting   Grooming: Supervision/safety;Standing Grooming Details (indicate cue type and reason): at sink Upper Body Bathing: Supervision/ safety;Standing   Lower Body Bathing: Minimal assistance;Sit to/from stand   Upper Body Dressing : Supervision/safety;Standing    Lower Body Dressing: Minimal assistance;Sit to/from Market researcher Details (indicate cue type and reason): did not occur   Toileting - Clothing Manipulation Details (indicate cue type and reason): did not occur   Tub/Shower Transfer Details (indicate cue type and reason): did not occur Functional mobility during ADLs: Min guard;Cueing for safety General ADL Comments: Pt limited by decreased cognition/AMS    Vision Additional Comments: vision seemed WNL, however unable to assess due to patient's decrease in cogntion. Will continue to further assess in functional context.           Pertinent Vitals/Pain Pain Assessment: No/denies pain     Hand Dominance Right   Extremity/Trunk Assessment Upper Extremity Assessment Upper Extremity Assessment: Overall WFL for tasks assessed   Lower Extremity Assessment Lower Extremity Assessment: Defer to PT evaluation       Communication Communication Communication: No difficulties   Cognition Arousal/Alertness: Awake/alert Behavior During Therapy: Impulsive Overall Cognitive Status: No family/caregiver present to determine baseline cognitive functioning Area of Impairment: Orientation;Safety/judgement;Awareness;Problem solving Orientation Level: Disoriented to;Place;Time;Situation   Memory: Decreased short-term memory   Safety/Judgement: Decreased awareness of safety;Decreased awareness of deficits Awareness: Intellectual Problem Solving: Requires verbal cues General Comments: During LB bathing, pt perseverated on bathing one area over and over again.               Home Living Family/patient expects to be discharged to:: Private residence Living Arrangements: Alone Available Help at Discharge: Neighbor;Available PRN/intermittently Home Equipment: None   Additional Comments: unable to obtain information due to patient's cognition/AMS           OT Diagnosis: Generalized weakness;Altered mental status   OT  Problem List: Decreased strength;Decreased activity tolerance;Impaired balance (sitting and/or standing);Decreased cognition;Decreased safety awareness;Decreased knowledge of use of DME or AE  OT Treatment/Interventions: Self-care/ADL training;Therapeutic exercise;Energy conservation;DME and/or AE instruction;Therapeutic activities;Patient/family education;Balance training    OT Goals(Current goals can be found in the care plan section) Acute Rehab OT Goals Patient Stated Goal: Unable to state OT Goal Formulation: Patient unable to participate in goal setting Time For Goal Achievement: 02/12/15 Potential to Achieve Goals: Fair ADL Goals Pt Will Perform Grooming: with supervision;standing Pt Will Perform Lower Body Bathing: with supervision;sit to/from stand Pt Will Perform Lower Body Dressing: with supervision;sit to/from stand Pt Will Transfer to Toilet: with supervision;ambulating;bedside commode Additional ADL Goal #1: Pt will be supervision for functional mobility   OT Frequency: Min 2X/week   Barriers to D/C: Decreased caregiver support       Co-evaluation PT/OT/SLP Co-Evaluation/Treatment: Yes Reason for Co-Treatment: Necessary to address cognition/behavior during functional activity;For patient/therapist safety PT goals addressed during session: Mobility/safety with mobility;Balance OT goals addressed during session: ADL's and self-care;Strengthening/ROM      End of Session Nurse Communication: Mobility status;Other (comment) (Discussed doffing wrist restraints while patient up in recliner. Also discussed need for timed toileting. )  Activity Tolerance: Patient tolerated treatment well Patient left: in chair;with call bell/phone within reach;with chair alarm set   Time: [id number] OT Time Calculation (min): 30 min Charges:  OT General Charges $OT Visit: 1 Procedure OT Evaluation $Initial OT Evaluation Tier I: 1 Procedure LOW tier evaluation  Edwin CapPatricia Capri Raben , MS, OTR/L,  CLT Pager: [phone] 01/29/2015, 3:40 PM

## 2015-01-29 NOTE — Evaluation (Signed)
Physical Therapy Evaluation Patient Details Name: Victoria Hubbard MRN: [id number] DOB: 12-26-1945 Today's Date: 01/29/2015   History of Present Illness  Victoria Hubbard is an 70 y.o. female hx of seizures admitted with AMS  Clinical Impression  Pt admitted with above diagnosis. Pt currently with functional limitations due to the deficits listed below (see PT Problem List). Pt will benefit from skilled PT to increase their independence and safety with mobility to allow discharge to the venue listed below.  Pt presents with decreased safety and decreased balance, as well as decreased cognitive status.  Pt unable to answer most questions in a meaningful way, although muttering words.  Recommend SNF     Follow Up Recommendations SNF    Equipment Recommendations  None recommended by PT    Recommendations for Other Services       Precautions / Restrictions Precautions Precautions: Fall Restrictions Weight Bearing Restrictions: No      Mobility  Bed Mobility Overal bed mobility: Independent                Transfers Overall transfer level: Needs assistance Equipment used: None Transfers: Sit to/from Stand Sit to Stand: Min guard         General transfer comment: min/guard for steadying  Ambulation/Gait   Ambulation Distance (Feet): 100 Feet Assistive device: None Gait Pattern/deviations: Step-through pattern     General Gait Details: Pt with some unsteadiness, but able to turn head with gait with no LOB.  Instructed to find her room number and she was able to locate room by room number.  Stairs            Wheelchair Mobility    Modified Rankin (Stroke Patients Only)       Balance Overall balance assessment: Needs assistance;History of Falls (Pt stated she falls, but unable to get any more info on the falls) Sitting-balance support: No upper extremity supported;Feet supported Sitting balance-Leahy Scale: Good     Standing balance support: No upper extremity  supported;During functional activity Standing balance-Leahy Scale: Fair Standing balance comment: min/guard for steadying                             Pertinent Vitals/Pain Pain Assessment: No/denies pain    Home Living Family/patient expects to be discharged to:: Private residence Living Arrangements: Alone Available Help at Discharge: Neighbor;Available PRN/intermittently           Home Equipment: None Additional Comments: unable to obtain information due to patient's cognition/AMS    Prior Function                 Hand Dominance   Dominant Hand: Right    Extremity/Trunk Assessment   Upper Extremity Assessment: Overall WFL for tasks assessed           Lower Extremity Assessment: Defer to PT evaluation         Communication   Communication: No difficulties  Cognition Arousal/Alertness: Awake/alert Behavior During Therapy: Impulsive Overall Cognitive Status: No family/caregiver present to determine baseline cognitive functioning Area of Impairment: Orientation;Safety/judgement;Awareness;Problem solving Orientation Level: Disoriented to;Place;Time;Situation   Memory: Decreased short-term memory   Safety/Judgement: Decreased awareness of safety;Decreased awareness of deficits Awareness: Intellectual Problem Solving: Requires verbal cues General Comments: During LB bathing, pt perseverated on bathing one area over and over again.     General Comments General comments (skin integrity, edema, etc.): Pt had been in wrist restraints upon arrival.  Pt calmer with PT/OT.  Nursing okayed pt to be in recliner with chair alarm.    Exercises        Assessment/Plan    PT Assessment Patient needs continued PT services  PT Diagnosis Difficulty walking   PT Problem List Decreased balance;Decreased mobility;Decreased safety awareness  PT Treatment Interventions Gait training;Functional mobility training;Therapeutic activities;Therapeutic  exercise;Balance training   PT Goals (Current goals can be found in the Care Plan section) Acute Rehab PT Goals Patient Stated Goal: Unable to state PT Goal Formulation: Patient unable to participate in goal setting Time For Goal Achievement: 02/12/15 Potential to Achieve Goals: Good    Frequency Min 2X/week   Barriers to discharge Decreased caregiver support      Co-evaluation PT/OT/SLP Co-Evaluation/Treatment: Yes Reason for Co-Treatment: Necessary to address cognition/behavior during functional activity;For patient/therapist safety PT goals addressed during session: Mobility/safety with mobility;Balance OT goals addressed during session: ADL's and self-care;Strengthening/ROM       End of Session   Activity Tolerance: Patient tolerated treatment well Patient left: in chair;with call bell/phone within reach;with chair alarm set;Other (comment) (with lunch tray eating dessert) Nurse Communication: Mobility status         Time: [id number] PT Time Calculation (min) (ACUTE ONLY): 35 min   Charges:   PT Evaluation $Initial PT Evaluation Tier I: 1 Procedure (low tier)     PT G Codes:        Victoria Hubbard 01/29/2015, 3:41 PM

## 2015-01-29 NOTE — Progress Notes (Signed)
Echocardiogram 2D Echocardiogram has been performed.  Nolon RodBrown, Tony 01/29/2015, 9:07 AM

## 2015-01-29 NOTE — Progress Notes (Signed)
PROGRESS NOTE  Victoria Hubbard DOB: 18-Oct-1945 DOA: 01/26/2015 PCP: No primary care provider on file.  Victoria Hubbard is a 70 y.o. female With a history of HTN and seixures transferred from [facility] where patient presented last night with confusion and seizure activity. Limited history was able to be obtained. Troponin found to be elevated, [facility].  Supposedly on Carbamazepine at home.  Patient lives home alone and is brought to her doctor by neighbors-- no family that her PCP is aware of.     Assessment/Plan: Seizures. Apparently has history of seizures and is maintained on Tegretol.  -Head CT scan negative per report -Neurology following: Tegretol 400 milligrams by mouth 3 times a day -Tegretol level - there was question about whether patient was taking meds? -seizure precautions  Elevated Troponin, probably related to seizure activity but she has T wave inversions on ECG. .  -no cardiology intervention -cycle Troponins.  Hypertension.  -Restart home Zestoretic when awake.   Agitation -haldol PRN -seroquel at night  Elevated CK -trending down  Called # listed and was not a valid number -also called PCP to get baseline-- patient is brought to appointments by "friend/neighbor", no family that PCP is aware of.  Patient is soft spoken and is "mentally challenged"-- social work consult - will get psych consult for capacity-- I do not think she has-- most likely will need APS as no family to act as her surrogate  PT eval ordered  -requiring sitter as impulsive and unsteady   Code Status: full Family Communication: patient Disposition Plan:    Consultants:  Neuro  cards  Procedures:     HPI/Subjective: Calm this AM-- tried to feed me her breakfast Called by nursing at 12:20--- trying to walk naked in hallway  Objective: Filed Vitals:   01/29/15 0645 01/29/15 1034  BP: 138/74 142/68  Pulse:  78   Temp: 98.6 F (37 C)   Resp: 15     Intake/Output Summary (Last 24 hours) at 01/29/15 1206 Last data filed at 01/28/15 1300  Gross per 24 hour  Intake    240 ml  Output      0 ml  Net    240 ml   Filed Weights   01/26/15 1023  Weight: 50.4 kg (111 lb 1.8 oz)    Exam:   General:  Awake, soft spoken  Cardiovascular: rrr  Respiratory: clear  Abdomen: +BS, soft  Musculoskeletal: no edema   Data Reviewed: Basic Metabolic Panel:  Recent Labs Lab 01/26/15 1239 01/27/15 0645 01/28/15 0444 01/29/15 0728  NA  --  142 142 141  K  --  3.9 3.6 3.7  CL  --  111 109 103  CO2  --  22 23 26   GLUCOSE  --  61* 100* 95  BUN  --  15 10 10   CREATININE 0.97 1.10* 0.90 0.92  CALCIUM  --  9.9 9.6 9.6   Liver Function Tests:  Recent Labs Lab 01/27/15 0645  AST 42*  ALT 18  ALKPHOS 70  BILITOT 0.7  PROT 6.1*  ALBUMIN 3.0*   No results for input(s): LIPASE, AMYLASE in the last 168 hours. No results for input(s): AMMONIA in the last 168 hours. CBC:  Recent Labs Lab 01/26/15 1239 01/27/15 0645 01/28/15 0444 01/29/15 0728  WBC 8.0 7.6 7.2 4.2  HGB 9.8* 11.0* 10.5* 10.9*  HCT 29.6* 34.0* 32.0* 33.1*  MCV 95.2 95.2 93.8 94.3  PLT 154 176 166  161   Cardiac Enzymes:  Recent Labs Lab 01/26/15 1239 01/26/15 1916 01/27/15 01/27/15 0930 01/29/15 0728  CKTOTAL  --   --   --  1447* 867*  TROPONINI 0.10* 0.08* 0.07*  --   --    BNP (last 3 results) No results for input(s): BNP in the last 8760 hours.  ProBNP (last 3 results) No results for input(s): PROBNP in the last 8760 hours.  CBG: No results for input(s): GLUCAP in the last 168 hours.  Recent Results (from the past 240 hour(s))  MRSA PCR Screening     Status: None   Collection Time: 01/26/15 10:40 AM  Result Value Ref Range Status   MRSA by PCR NEGATIVE NEGATIVE Final    Comment:        The GeneXpert MRSA Assay (FDA approved for NASAL specimens only), is one component of a comprehensive MRSA  colonization surveillance program. It is not intended to diagnose MRSA infection nor to guide or monitor treatment for MRSA infections.      Studies: No results found.  Scheduled Meds: . carbamazepine  400 mg Oral BH-q7a  . carbamazepine  600 mg Oral QHS  . enoxaparin (LOVENOX) injection  40 mg Subcutaneous Q24H  . lisinopril  5 mg Oral Daily  . QUEtiapine  25 mg Oral QHS   Continuous Infusions:  Antibiotics Given (last 72 hours)    None      Principal Problem:   Seizures (HCC) Active Problems:   Essential hypertension   Nonspecific abnormal electrocardiogram (ECG) (EKG)   Elevated troponin level   Altered mental status    Time spent: 25 min    Tabbetha Kutscher [facility]  [facility] Pager [phone]. If 7PM-7AM, please contact night-coverage at [email or web address], password [facility] 01/29/2015, 12:06 PM  LOS: 3 days

## 2015-01-29 NOTE — Progress Notes (Signed)
EEG Completed; Results Pending  

## 2015-01-29 NOTE — Progress Notes (Signed)
Pt transferred via bed to new room 5W28. All pt belongings taken with pt. Report called to Kristen/RN and CCMD notified of new room assingment

## 2015-01-29 NOTE — Progress Notes (Signed)
Pt was found walking into hallway from her room with no clothes on except a bra. Charge RN tried to get Pt back into her room and attempted to put her clothes back on. Pt became combative and uncooperative. Pt did not want to put clothes on or sit down. Pt's gait was very unsteady. Dr. Benjamine MolaVann was paged about the situation. Per verbal order bilateral wrist restraints were applied, all 4 bedrails were raised. MD ordered 2mg  Haldol which was administered. Will reassess.

## 2015-01-29 NOTE — Progress Notes (Signed)
Around 3:00pm PT and OT wanted to work with PT. They were both aware of Pt being in restraints. Restraints were removed, Pt was toileted, sat in her chair and her lunch tray was set up so she could eat. Pt cooperated with all those tasks. After, PT and OT walked Pt in hallway. Pt was brought back to her room, sat in chair, but became restless so she was put back in bed. Pt so far has cooperated without restraints; MD aware. Close monitoring/observation has helped. Pt has become restless on 2-3 occasions, trying to get out of bed. Pt was toileted and walked in room. Will keep monitoring and move into room closer to nurses station.

## 2015-01-29 NOTE — Care Management Important Message (Signed)
Important Message  Patient Details  Name: Victoria Hubbard MRN: [id number] Date of Birth: 08/18/1945   Medicare Important Message Given:  Yes    [name], [name] 01/29/2015, 1:04 PMImportant Message  Patient Details  Name: Victoria Hubbard MRN: [id number] Date of Birth: 08/18/1945   Medicare Important Message Given:  Yes    [name] 01/29/2015, 1:04 PM

## 2015-01-29 NOTE — Progress Notes (Signed)
Subjective: 70 year old female patient admitted with seizures, on Tegretol. No further clinical seizures. She is noted to have some mild delirium with agitation and was noted to be walking outside her room and hallway with limited clothing.  No further clinical seizures.     Current facility-administered medications:  .  acetaminophen (TYLENOL) tablet 650 mg, 650 mg, Oral, Q6H PRN **OR** acetaminophen (TYLENOL) suppository 650 mg, 650 mg, Rectal, Q6H PRN, Meredeth IdeGagan S Lama, MD .  carbamazepine (TEGRETOL) tablet 400 mg, 400 mg, Oral, BH-q7a, Noel Christmasharles Stewart, 400 mg at 01/29/15 1034 .  carbamazepine (TEGRETOL) tablet 600 mg, 600 mg, Oral, QHS, Noel Christmasharles Stewart, 600 mg at 01/29/15 2121 .  enoxaparin (LOVENOX) injection 40 mg, 40 mg, Subcutaneous, Q24H, Meredeth IdeGagan S Lama, MD, 40 mg at 01/29/15 1812 .  haloperidol lactate (HALDOL) injection 2 mg, 2 mg, Intravenous, Q6H PRN, Joseph ArtJessica U Vann, DO, 2 mg at 01/29/15 1219 .  lisinopril (PRINIVIL,ZESTRIL) tablet 5 mg, 5 mg, Oral, Daily, Jessica U Vann, DO, 5 mg at 01/29/15 1035 .  LORazepam (ATIVAN) injection 2 mg, 2 mg, Intravenous, Q4H PRN, Meredeth IdeGagan S Lama, MD, 2 mg at 01/27/15 2330 .  ondansetron (ZOFRAN) tablet 4 mg, 4 mg, Oral, Q6H PRN **OR** ondansetron (ZOFRAN) injection 4 mg, 4 mg, Intravenous, Q6H PRN, Meredeth IdeGagan S Lama, MD .  QUEtiapine (SEROQUEL) tablet 25 mg, 25 mg, Oral, QHS, Jessica U Vann, DO, 25 mg at 01/29/15 2122   Filed Vitals:   01/29/15 1034 01/29/15 2141  BP: 142/68 161/69  Pulse:  82  Temp:  98.3 F (36.8 C)  Resp:  20    patient seated in a chair, appears calm , mildly drowsy , fluent speech , no dysarthria or aphasia noted . Oriented to self, place, not the month or year . He followed simple commands easily   full motor strength in all 4 extremities. Cranial nerve II through XII intact. No abnormal involuntary movements or asterixis noted. No limb ataxia noted. no nystagmus.   Impression:   70 year old female patient with epilepsy, currently  on Tegretol. No further clinical seizures. A follow-up EEG done today showed evidence of mild encephalopathy without any abnormal discharges. She had symptoms suggestive of acute delirium with some agitation earlier today. May benefit from adding small dose of Seroquel 12.5 mg in the morning after breakfast, 12.5 mg after lunch and 25 mg after dinner, titrate the medication dose based on a sedative side effects and delirium control.

## 2015-01-29 NOTE — Care Management Note (Signed)
Case Management Note  Patient Details  Name: Victoria FarberJudy Hubbard MRN: [id number] Date of Birth: 09/10/1945  Subjective/Objective:   Patient is from home alone, patient is confused, for EEG and MRI today,only alert and oriented x 1 to 2. NCM will cont to follow for dc needs.  Await pt eval.                  Action/Plan:   Expected Discharge Date:                  Expected Discharge Plan:  Skilled Nursing Facility  In-House Referral:     Discharge planning Services  CM Consult  Post Acute Care Choice:    Choice offered to:     DME Arranged:    DME Agency:     HH Arranged:    HH Agency:     Status of Service:  In process, will continue to follow  Medicare Important Message Given:  Yes Date Medicare IM Given:    Medicare IM give by:    Date Additional Medicare IM Given:    Additional Medicare Important Message give by:     If discussed at Long Length of Stay Meetings, dates discussed:    Additional Comments:  Leone Havenaylor, Jashley Yellin Clinton, RN 01/29/2015, 11:47 AM

## 2015-01-29 NOTE — Care Management Important Message (Signed)
Important Message  Patient Details  Name: Mickey FarberJudy Swett MRN: [id number] Date of Birth: May 12, 1945   Medicare Important Message Given:  Yes    Leone Havenaylor, Obera Stauch Clinton, RN 01/29/2015, 11:46 AMImportant Message  Patient Details  Name: Mickey FarberJudy Navarrette MRN: [id number] Date of Birth: May 12, 1945   Medicare Important Message Given:  Yes    Leone Havenaylor, Hema Lanza Clinton, RN 01/29/2015, 11:45 AM

## 2015-01-29 NOTE — Progress Notes (Signed)
Pt is a transfer from [facility]. Belongings at bedside. Vital signs obtained and stable at this time. Pt educated about using the call bell and phone. Bed in lowest position. Call bell within reach. Bed alarm on. Will continue to monitor.

## 2015-01-29 NOTE — Procedures (Signed)
ELECTROENCEPHALOGRAM REPORT   Patient: Victoria Hubbard        Age: 70 y.o.        Sex: female Referring Physician: Dr Roseanne RenoStewart Report Date:  01/29/2015        Interpreting Physician: Omelia BlackwaterSUMNER, Terrisa Curfman JUSTIN  History: Victoria Hubbard is an 70 y.o. female hx of seizures admitted with AMS  Medications:  I have reviewed the patient's current medications.  Conditions of Recording:  This is a 16 channel EEG carried out with the patient in the altered state.  Description:  The waking background activity consists of a low voltage, symmetrical, poorly organized,theta activity, seen from the parieto-occipital and posterior temporal regions. No posterior dominant alpha rhythm is noted. No focal slowing or epileptiform activity is noted.  Hyperventilation was not performed. Intermittent photic stimulation was not performed.   IMPRESSION: This is an abnormal EEG secondary to general background slowing indicating a mild cerebral disturbance (encephalopathy). No epileptiform activity noted.    Elspeth Choeter Loghan Kurtzman, DO [facility] [phone]  If 7pm- 7am, please page neurology on call as listed in AMION. 01/29/2015, 10:37 AM

## 2015-01-29 NOTE — Progress Notes (Signed)
    SUBJECTIVE:  No distress.  Confused.   PHYSICAL EXAM Filed Vitals:   01/28/15 2000 01/28/15 2340 01/29/15 0000 01/29/15 0220  BP: 139/52 152/69 135/62 163/69  Pulse:    71  Temp:  98.4 F (36.9 C)  98.2 F (36.8 C)  TempSrc:  Axillary    Resp: 15 17 16 15   Height:      Weight:      SpO2:  98%  100%   General:  No distress, sitting in a chair at the nurses station.  LABS:  Results for orders placed or performed during the hospital encounter of 01/26/15 (from the past 24 hour(s))  Carbamazepine level, total     Status: Abnormal   Collection Time: 01/28/15 10:30 AM  Result Value Ref Range   Carbamazepine Lvl 14.5 (H) 4.0 - 12.0 ug/mL    Intake/Output Summary (Last 24 hours) at 01/29/15 [id number] Last data filed at 01/28/15 1300  Gross per 24 hour  Intake    720 ml  Output      0 ml  Net    720 ml     ASSESSMENT AND PLAN:  ELEVATED TROPONIN:   Non specific.   Echo ordered but not done yet.  I will wait for these results and follow from a distance.    Fayrene FearingJames [facility] 01/29/2015 6:13 AM

## 2015-01-30 DIAGNOSIS — F4322 Adjustment disorder with anxiety: Secondary | ICD-10-CM

## 2015-01-30 LAB — CARBAMAZEPINE, FREE AND TOTAL
CARBAMAZEPINE FREE: NOT DETECTED ug/mL (ref 0.6–4.2)
Carbamazepine, Total: <0.5 ug/mL — ABNORMAL LOW (ref 4.0–12.0)

## 2015-01-30 NOTE — Progress Notes (Signed)
Subjective: No further seizures and now shows no agitation.   Exam: Filed Vitals:   01/29/15 2141 01/30/15 0630  BP: 161/69 123/57  Pulse: 82 68  Temp: 98.3 F (36.8 C) 98.6 F (37 C)  Resp: 20         Gen: In bed, NAD MS: alert and aware she is in the hospital. Fluent speech and able to follow commands.  CN: intact vision, PERRLA, EOMI, TML face symmetric Motor: moving all extremities antigravity Sensory: intact throughout  Pertinent Labs: None   Felicie MornDavid Tareq Dwan PA-C [facility] [phone]  Impression: [age] year old female patient with epilepsy, currently on Tegretol. No further clinical seizures. A follow-up EEG done today showed evidence of mild encephalopathy without any abnormal discharges.   Recommendations: 1) continue current dose of Tegretol and Seroquel. No further recommendations. Follow up with out patient neurology at discharge.  2) No driving, operating heavy machinery, perform activities at heights, swimming or participation in water activities until release by outpatient physician.  This has been discussed with patient.   Neurology S/O    01/30/2015, 8:49 AM

## 2015-01-30 NOTE — Consult Note (Signed)
[facility] Psychiatry Consult   Reason for Consult:  Capacity evaluation Referring Physician:  Dr. Eliseo Squires Patient Identification: Victoria Hubbard MRN:  [id number] Principal Diagnosis: Seizures [facility]) Diagnosis:   Patient Active Problem List   Diagnosis Date Noted  . Agitation [R45.1]   . Acute delirium [R41.0]   . Altered mental status [R41.82]   . Seizures ([place]) [R56.9] 01/26/2015  . Essential hypertension [I10] 01/26/2015  . Nonspecific abnormal electrocardiogram (ECG) (EKG) [R94.31] 01/26/2015  . Elevated troponin level [R79.89] 01/26/2015    Total Time spent with patient: 1 hour  Subjective:   Victoria Hubbard is a 70 y.o. female patient admitted with seizures.  HPI: Victoria Hubbard is a 70 [facility] seizures some confusion from [facility]. Patient was seen by neurology who made appropriate medication adjustment since then patient has been free from the seizures. Patient is able to stay awake, alert and oriented to herself and being in hospital. Patient reported she has a friend Collie Siad who is coming home and helping her and she has a health who can take her to nearby grocery stores to by her groceries. Patient wishes she is want to go home and stay in her surroundings and the familiar places. Patient also reported she has no family members alive. Patient appeared to be slow responder and sometimes mumble with the words. Patient has no irritability, agitation, aggressive behavior, or combativeness Patient has limitation in her cognition but at the same time see has knowledge about her current medical condition, required treatment and need of being hospitalized. Patient reportedly compliant with her medication and has no adverse affects. Patient is willing to follow up with primary care physician and compliant with her medication management upon discharge from the hospital. Patient reported she can call one of friends who can provide  transportation.     Past Psychiatric History: none reported  Risk to Self: Is patient at risk for suicide?: No Risk to Others:   Prior Inpatient Therapy:   Prior Outpatient Therapy:    Past Medical History:  Past Medical History  Diagnosis Date  . Seizures ([place])    No past surgical history on file. Family History: No family history on file. Family Psychiatric  History: Patient lives in the home by herself and has few friends helping her surroundings.  Social History:  History  Alcohol Use: Not on file     History  Drug Use Not on file    Social History   Social History  . Marital Status: Single    Spouse Name: N/A  . Number of Children: N/A  . Years of Education: N/A   Social History Main Topics  . Smoking status: Current Some Day Smoker -- 1.00 packs/day    Types: Cigarettes  . Smokeless tobacco: Current User    Types: Snuff  . Alcohol Use: Not on file  . Drug Use: Not on file  . Sexual Activity: Not on file   Other Topics Concern  . Not on file   Social History Narrative   Additional Social History:                          Allergies:  No Known Allergies  Labs:  Results for orders placed or performed during the hospital encounter of 01/26/15 (from the past 48 hour(s))  CBC     Status: Abnormal   Collection Time: 01/29/15  7:28 AM  Result Value Ref Range  WBC 4.2 4.0 - 10.5 K/uL   RBC 3.51 (L) 3.87 - 5.11 MIL/uL   Hemoglobin 10.9 (L) 12.0 - 15.0 g/dL   HCT 33.1 (L) 36.0 - 46.0 %   MCV 94.3 78.0 - 100.0 fL   MCH 31.1 26.0 - 34.0 pg   MCHC 32.9 30.0 - 36.0 g/dL   RDW 12.7 11.5 - 15.5 %   Platelets 161 150 - 400 K/uL  Basic metabolic panel     Status: None   Collection Time: 01/29/15  7:28 AM  Result Value Ref Range   Sodium 141 135 - 145 mmol/L   Potassium 3.7 3.5 - 5.1 mmol/L   Chloride 103 101 - 111 mmol/L   CO2 26 22 - 32 mmol/L   Glucose, Bld 95 65 - 99 mg/dL   BUN 10 6 - 20 mg/dL   Creatinine, Ser 0.92 0.44 - 1.00 mg/dL    Calcium 9.6 8.9 - 10.3 mg/dL   GFR calc non Af Amer >60 >60 mL/min   GFR calc Af Amer >60 >60 mL/min    Comment: (NOTE) The eGFR has been calculated using the CKD EPI equation. This calculation has not been validated in all clinical situations. eGFR's persistently <60 mL/min signify possible Chronic Kidney Disease.    Anion gap 12 5 - 15  CK     Status: Abnormal   Collection Time: 01/29/15  7:28 AM  Result Value Ref Range   Total CK 867 (H) 38 - 234 U/L  Carbamazepine level, total     Status: None   Collection Time: 01/29/15  8:50 AM  Result Value Ref Range   Carbamazepine Lvl 5.9 4.0 - 12.0 ug/mL    Current Facility-Administered Medications  Medication Dose Route Frequency Provider Last Rate Last Dose  . acetaminophen (TYLENOL) tablet 650 mg  650 mg Oral Q6H PRN Oswald Hillock, MD       Or  . acetaminophen (TYLENOL) suppository 650 mg  650 mg Rectal Q6H PRN Oswald Hillock, MD      . carbamazepine (TEGRETOL) tablet 400 mg  400 mg Oral BH-q7a Charles Stewart   400 mg at 01/30/15 2725  . carbamazepine (TEGRETOL) tablet 600 mg  600 mg Oral QHS Charles Stewart   600 mg at 01/29/15 2121  . enoxaparin (LOVENOX) injection 40 mg  40 mg Subcutaneous Q24H Oswald Hillock, MD   40 mg at 01/29/15 1812  . lisinopril (PRINIVIL,ZESTRIL) tablet 5 mg  5 mg Oral Daily Geradine Girt, DO   5 mg at 01/30/15 0915  . LORazepam (ATIVAN) injection 2 mg  2 mg Intravenous Q4H PRN Oswald Hillock, MD   2 mg at 01/27/15 2330  . ondansetron (ZOFRAN) tablet 4 mg  4 mg Oral Q6H PRN Oswald Hillock, MD       Or  . ondansetron (ZOFRAN) injection 4 mg  4 mg Intravenous Q6H PRN Oswald Hillock, MD      . QUEtiapine (SEROQUEL) tablet 12.5 mg  12.5 mg Oral BID Ram Noffsinger Mandril, MD   12.5 mg at 01/30/15 0604  . QUEtiapine (SEROQUEL) tablet 25 mg  25 mg Oral QHS Geradine Girt, DO   25 mg at 01/29/15 2122    Musculoskeletal: Strength & Muscle Tone: decreased Gait & Station: unable to stand Patient leans:  N/A  Psychiatric Specialty Exam: ROS  Blood pressure 123/57, pulse 68, temperature 98.6 F (37 C), temperature source Oral, resp. rate 20, height '5\' 2"'$  (1.575 m), weight  50.4 kg (111 lb 1.8 oz), SpO2 100 %.Body mass index is 20.32 kg/(m^2).  General Appearance: Casual  Eye Contact::  Good  Speech:  Slow  Volume:  Decreased  Mood:  Euthymic  Affect:  Constricted and Depressed  Thought Process:  Coherent and Goal Directed  Orientation:  Other:  oriented to her name, place and person.  Thought Content:  WDL  Suicidal Thoughts:  No  Homicidal Thoughts:  No  Memory:  Immediate;   Fair Recent;   Fair  Judgement:  Fair  Insight:  Fair  Psychomotor Activity:  Decreased  Concentration:  Fair  Recall:  [employer] of Knowledge:Fair  Language: Fair  Akathisia:  Negative  Handed:  Right  AIMS (if indicated):     Assets:  Communication Skills Desire for Improvement Financial Resources/Insurance Housing Leisure Time Resilience  ADL's:  Impaired  Cognition: Impaired,  Mild  Sleep:      Treatment Plan Summary: Daily contact with patient to assess and evaluate symptoms and progress in treatment and Medication management  Patient has capacity to make her own medical decisions and living arrangements based on my evaluation today Continue current medication management without changes Patient benefit from skilled nursing facility with rehabilitation services. Appreciate psychiatric consultation and we sign off at this time Please contact [phone] or [phone] if needs further assistance   Disposition: Patient does not meet criteria for psychiatric inpatient admission. Supportive therapy provided about ongoing stressors.  Jaci Desanto,JANARDHAHA R. 01/30/2015 10:37 AM

## 2015-01-30 NOTE — Progress Notes (Signed)
    SUBJECTIVE:  No distress.  She denies chest pain and SOB   PHYSICAL EXAM Filed Vitals:   01/29/15 0645 01/29/15 1034 01/29/15 2141 01/30/15 0630  BP: 138/74 142/68 161/69 123/57  Pulse: 78  82 68  Temp: 98.6 F (37 C)  98.3 F (36.8 C) 98.6 F (37 C)  TempSrc: Oral  Oral Oral  Resp: 15  20   Height:      Weight:      SpO2: 100%  98% 100%   General:  No distress Lungs:  Clear Heart:  RRR Abdomen:  Positive bowel sounds, no rebound no guarding Extremities:  No edema  LABS:  No results found for this or any previous visit (from the past 24 hour(s)). No intake or output data in the 24 hours ending 01/30/15 0937  ECHO:   - Left ventricle: The cavity size was normal. Wall thickness was normal. Systolic function was normal. The estimated ejection fraction was in the range of 55% to 60%. Although no diagnostic regional wall motion abnormality was identified, this possibility cannot be completely excluded on the basis of this study. Doppler parameters are consistent with abnormal left ventricular relaxation (grade 1 diastolic dysfunction). - Aortic valve: Poorly visualized. There was no significant regurgitation. - Mitral valve: Mildly thickened leaflets . There was trivial regurgitation. - Right atrium: Central venous pressure (est): 8 mm Hg. - Tricuspid valve: There was trivial regurgitation. - Pulmonary arteries: Systolic pressure could not be accurately estimated. - Pericardium, extracardiac: There was no pericardial effusion.   ASSESSMENT AND PLAN:  ELEVATED TROPONIN:   Non specific.  Repeat EKG without acute changes.  Echo without significant abnormalities.  No further cardiac work up .  We will sign off.  Fayrene FearingJames [facility] 01/30/2015 9:37 AM

## 2015-01-30 NOTE — Clinical Social Work Note (Signed)
Clinical Social Work Assessment  Patient Details  Name: Victoria Hubbard MRN: [id number] Date of Birth: Nov 14, 1945  Date of referral:  01/30/15               Reason for consult:  Facility Placement                Permission sought to share information with:  Facility Medical sales representativeContact Representative, Family Supports Permission granted to share information::  No (Patient disoriented. Completed assessment w/ neighbor, Victoria Hubbard.)  Name::     Victoria Hubbard  [facility]  Relationship::  Neighbor  Contact Information:  [phone]  Housing/Transportation Living arrangements for the past 2 months:  Single Family Home Source of Information:  Friend/Neighbor Patient Interpreter Needed:  None Criminal Activity/Legal Involvement Pertinent to Current Situation/Hospitalization:  No - Comment as needed Significant Relationships:  Neighbor, Other Family Members Lives with:  Self Do you feel safe going back to the place where you live?  No Need for family participation in patient care:  Yes (Comment)  Care giving concerns: CSW received referral for possible SNF placement at time of discharge. Patient is disoriented. CSW spoke with patient's neighbor regarding PT recommendation of SNF placement at time of discharge. Per patient's neighbor, patient lives alone and neighbor is currently unable to care for patient at her home given patient's current needs and fall risk. Patient's neighbor reports being unable to make decisions for the patient at this time. CSW will continue to follow patient for discharge needs.    Social Worker assessment / plan:  CSW waiting to hear from patient's neighbor regarding contact info for patient's family members.  Employment status:  Retired Health and safety inspectornsurance information:  Medicare PT Recommendations:  Skilled Nursing Facility Information / Referral to community resources:  Skilled Nursing Facility  Patient/Family's Response to care:  Patient's neighbor is hoping  to find CSW a contact number for someone in the patient's family.  Patient/Family's Understanding of and Emotional Response to Diagnosis, Current Treatment, and Prognosis:  No questions at this time.  Emotional Assessment Appearance:  Appears stated age Attitude/Demeanor/Rapport:  Unable to Assess (Patient disoriented) Affect (typically observed):  Unable to Assess (Patient disoriented) Orientation:  Oriented to Self Alcohol / Substance use:  Not Applicable Psych involvement (Current and /or in the community):  No (Comment)  Discharge Needs  Concerns to be addressed:  Care Coordination, Decision making concerns Readmission within the last 30 days:  No Current discharge risk:  Lives alone Barriers to Discharge:  Continued Medical Work up   Victoria Hubbard, LCSWA 01/30/2015, 3:30 PM

## 2015-01-30 NOTE — NC FL2 (Signed)
[place] MEDICAID FL2 LEVEL OF CARE SCREENING TOOL     IDENTIFICATION  Patient Name: Victoria Hubbard Birthdate: [birth year] Sex: female Admission Date (Current Location): 01/26/2015  [facility] and [place] Number:  Producer, television/film/video and Address:  [facility] [place] [facility], [address] [place], [place] [address]      Provider Number: [id number]  Attending Physician Name and Address:  Joseph Art, DO  Relative Name and Phone Number:  N/A    Current Level of Care: Hospital Recommended Level of Care: Skilled Nursing Facility Prior Approval Number:    Date Approved/Denied:   PASRR Number: [id number]  Discharge Plan: SNF    Current Diagnoses: Patient Active Problem List   Diagnosis Date Noted  . Agitation   . Acute delirium   . Altered mental status   . Seizures (HCC) 01/26/2015  . Essential hypertension 01/26/2015  . Nonspecific abnormal electrocardiogram (ECG) (EKG) 01/26/2015  . Elevated troponin level 01/26/2015    Orientation RESPIRATION BLADDER Height & Weight    Self  Normal Incontinent 5\' 2"  (157.5 cm) 111 lbs.  BEHAVIORAL SYMPTOMS/MOOD NEUROLOGICAL BOWEL NUTRITION STATUS    Convulsions/Seizures (Seizures) Continent  (Please See DC Summary)  AMBULATORY STATUS COMMUNICATION OF NEEDS Skin   Extensive Assist Verbally Normal                       Personal Care Assistance Level of Assistance  Bathing, Feeding, Dressing Bathing Assistance: Limited assistance Feeding assistance: Independent Dressing Assistance: Limited assistance     Functional Limitations Info             SPECIAL CARE FACTORS FREQUENCY  PT (By licensed PT), OT (By licensed OT)     PT Frequency: 5x/week OT Frequency: Min 2x/week            Contractures      Additional Factors Info  Code Status, Allergies Code Status Info: Full Allergies Info: NKA           Current Medications (01/30/2015):  This is the current hospital active medication list Current  Facility-Administered Medications  Medication Dose Route Frequency Provider Last Rate Last Dose  . acetaminophen (TYLENOL) tablet 650 mg  650 mg Oral Q6H PRN Meredeth Ide, MD       Or  . acetaminophen (TYLENOL) suppository 650 mg  650 mg Rectal Q6H PRN Meredeth Ide, MD      . carbamazepine (TEGRETOL) tablet 400 mg  400 mg Oral BH-q7a Charles Stewart   400 mg at 01/30/15 2725  . carbamazepine (TEGRETOL) tablet 600 mg  600 mg Oral QHS Charles Stewart   600 mg at 01/29/15 2121  . enoxaparin (LOVENOX) injection 40 mg  40 mg Subcutaneous Q24H Meredeth Ide, MD   40 mg at 01/29/15 1812  . lisinopril (PRINIVIL,ZESTRIL) tablet 5 mg  5 mg Oral Daily Joseph Art, DO   5 mg at 01/30/15 0915  . LORazepam (ATIVAN) injection 2 mg  2 mg Intravenous Q4H PRN Meredeth Ide, MD   2 mg at 01/27/15 2330  . ondansetron (ZOFRAN) tablet 4 mg  4 mg Oral Q6H PRN Meredeth Ide, MD       Or  . ondansetron (ZOFRAN) injection 4 mg  4 mg Intravenous Q6H PRN Meredeth Ide, MD      . QUEtiapine (SEROQUEL) tablet 12.5 mg  12.5 mg Oral BID Ram Daniel Nones, MD   12.5 mg at 01/30/15 0604  .  QUEtiapine (SEROQUEL) tablet 25 mg  25 mg Oral QHS Joseph ArtJessica U Vann, DO   25 mg at 01/29/15 2122     Discharge Medications: Please see discharge summary for a list of discharge medications.  Relevant Imaging Results:  Relevant Lab Results:   Additional Information SSN: [id number]  Mearl LatinNadia S Jasilyn Holderman, LCSWA

## 2015-01-30 NOTE — Progress Notes (Signed)
CSW contacted patient's PCP Office, [phone], for updated emergency contact info. PCP office gave CS[facility]'s [facility], [phone].  Ms. Victoria Hubbard reported that she does not feel she can make decisions for patient at this time. She reported that the patient has a brother in [place], a cousin, a niece, and a nephew. Ms. Victoria Hubbard stated she would try to find a contact number for someone in the family and will call CSW back.  CSW continuing to follow.  Osborne Cascoadia Deshonda Cryderman LCSWA [phone]

## 2015-01-30 NOTE — Progress Notes (Signed)
PROGRESS NOTE  Victoria Hubbard ZOX:[id number] DOB: Jan 05, 1946 DOA: 01/26/2015 PCP: No primary care provider on file.  Victoria Hubbard is a 70 y.o. female With a history of HTN and seixures transferred from [facility] where patient presented last night with confusion and seizure activity. Limited history was able to be obtained. Troponin found to be elevated, patient transferred to [facility] for further evaluation.  Supposedly on Carbamazepine at home.  Patient lives home alone and is brought to her doctor by neighbors-- no family that her PCP is aware of.   PCP states that she is "mentally challenged"   Assessment/Plan: Seizures. Apparently has history of seizures and is maintained on Tegretol.  -Head CT scan negative per report -Neurology following: Tegretol 400 milligrams by mouth 3 times a day -seizure precautions  Elevated Troponin, probably related to seizure activity but she has T wave inversions on ECG. .  -no cardiology intervention -cycle Troponins.  Hypertension.  -Restart home Zestoretic when awake.   Agitation -seroquel  -psych consult- for capacity as patient will most likely become a ward of the state as not family  Elevated CK -trending down  Called # listed and was not a valid number -also called PCP to get baseline-- patient is brought to appointments by "friend/neighbor", no family that PCP is aware of.  Patient is soft spoken and is "mentally challenged"-- social work consult - will get psych consult for capacity-- I do not think she has-- most likely will need APS as no family to act as her surrogate      Code Status: full Family Communication: patient Disposition Plan: SNF?   Consultants:  Neuro  cards  Procedures:     HPI/Subjective: In a chair-- "I'm just here"  Objective: Filed Vitals:   01/29/15 2141 01/30/15 0630  BP: 161/69 123/57  Pulse: 82 68  Temp: 98.3 F (36.8 C) 98.6 F (37 C)  Resp: 20    No intake or output data  in the 24 hours ending 01/30/15 0941 Filed Weights   01/26/15 1023  Weight: 50.4 kg (111 lb 1.8 oz)    Exam:   General:  Awake, soft spoken  Cardiovascular: rrr  Respiratory: clear  Abdomen: +BS, soft  Musculoskeletal: no edema   Data Reviewed: Basic Metabolic Panel:  Recent Labs Lab 01/26/15 1239 01/27/15 0645 01/28/15 0444 01/29/15 0728  NA  --  142 142 141  K  --  3.9 3.6 3.7  CL  --  111 109 103  CO2  --  22 23 26   GLUCOSE  --  61* 100* 95  BUN  --  15 10 10   CREATININE 0.97 1.10* 0.90 0.92  CALCIUM  --  9.9 9.6 9.6   Liver Function Tests:  Recent Labs Lab 01/27/15 0645  AST 42*  ALT 18  ALKPHOS 70  BILITOT 0.7  PROT 6.1*  ALBUMIN 3.0*   No results for input(s): LIPASE, AMYLASE in the last 168 hours. No results for input(s): AMMONIA in the last 168 hours. CBC:  Recent Labs Lab 01/26/15 1239 01/27/15 0645 01/28/15 0444 01/29/15 0728  WBC 8.0 7.6 7.2 4.2  HGB 9.8* 11.0* 10.5* 10.9*  HCT 29.6* 34.0* 32.0* 33.1*  MCV 95.2 95.2 93.8 94.3  PLT 154 176 166 161   Cardiac Enzymes:  Recent Labs Lab 01/26/15 1239 01/26/15 1916 01/27/15 01/27/15 0930 01/29/15 0728  CKTOTAL  --   --   --  1447* 867*  TROPONINI 0.10* 0.08* 0.07*  --   --  BNP (last 3 results) No results for input(s): BNP in the last 8760 hours.  ProBNP (last 3 results) No results for input(s): PROBNP in the last 8760 hours.  CBG: No results for input(s): GLUCAP in the last 168 hours.  Recent Results (from the past 240 hour(s))  MRSA PCR Screening     Status: None   Collection Time: 01/26/15 10:40 AM  Result Value Ref Range Status   MRSA by PCR NEGATIVE NEGATIVE Final    Comment:        The GeneXpert MRSA Assay (FDA approved for NASAL specimens only), is one component of a comprehensive MRSA colonization surveillance program. It is not intended to diagnose MRSA infection nor to guide or monitor treatment for MRSA infections.      Studies: Mr Brain Wo  Contrast  01/30/2015  CLINICAL DATA:  Initial evaluation for recurrent generalized seizure and altered mental status. EXAM: MRI HEAD WITHOUT CONTRAST TECHNIQUE: Multiplanar, multiecho pulse sequences of the brain and surrounding structures were obtained without intravenous contrast. COMPARISON:  None. FINDINGS: Study somewhat limited by patient positioning and motion artifact. Mild diffuse prominence of the CSF containing spaces is compatible with generalized age-related cerebral atrophy. Very mild patchy T2/FLAIR hyperintensity within the periventricular white matter are noted, most likely related to chronic small vessel ischemic disease. Remote lacunar infarct within the right thalamus, right caudate head, and right putamen. Additional small remote lacunar infarcts within the central pons. No abnormal foci of restricted diffusion to suggest acute intracranial infarct. Gray-white matter differentiation maintained. Normal intravascular flow voids maintained. No acute or chronic intracranial hemorrhage. No mass lesion, midline shift, or mass effect. Hippocampi are grossly symmetric with normal morphology and signal intensity, although evaluation somewhat limited due to patient positioning. Craniocervical junction within normal limits. Degenerative spondylolysis noted within the visualized upper cervical spine with associated moderate canal stenosis. Pituitary gland within normal limits. No acute abnormality about the orbits. Paranasal sinuses are clear. No significant mastoid effusion. Inner ear structures grossly normal. Bone marrow signal intensity within normal limits. No scalp soft tissue abnormality. IMPRESSION: 1. No acute intracranial process identified. 2. 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 3. Age-related cerebral atrophy with mild chronic small vessel ischemic disease. Electronically Signed   By: Rise MuBenjamin  McClintock M.D.   On:  01/30/2015 05:40    Scheduled Meds: . carbamazepine  400 mg Oral BH-q7a  . carbamazepine  600 mg Oral QHS  . enoxaparin (LOVENOX) injection  40 mg Subcutaneous Q24H  . lisinopril  5 mg Oral Daily  . QUEtiapine  12.5 mg Oral BID  . QUEtiapine  25 mg Oral QHS   Continuous Infusions:  Antibiotics Given (last 72 hours)    None      Principal Problem:   Seizures (HCC) Active Problems:   Essential hypertension   Nonspecific abnormal electrocardiogram (ECG) (EKG)   Elevated troponin level   Altered mental status   Agitation   Acute delirium    Time spent: 25 min    Prapti Grussing U [facility]  [facility] Pager [phone]. If 7PM-7AM, please contact night-coverage at [email or web address], password [facility] 01/30/2015, 9:41 AM  LOS: 4 days

## 2015-01-31 ENCOUNTER — Encounter (HOSPITAL_COMMUNITY): Payer: Self-pay | Admitting: General Practice

## 2015-01-31 DIAGNOSIS — R4182 Altered mental status, unspecified: Secondary | ICD-10-CM

## 2015-01-31 MED ORDER — CARBAMAZEPINE 200 MG PO TABS: 400.0000 mg | ORAL_TABLET | ORAL | Status: DC

## 2015-01-31 MED ORDER — QUETIAPINE FUMARATE 25 MG PO TABS
25.0000 mg | ORAL_TABLET | Freq: Every day | ORAL | Status: DC
Start: 2015-01-31 — End: 2015-02-01

## 2015-01-31 MED ORDER — CARBAMAZEPINE 200 MG PO TABS
600.0000 mg | ORAL_TABLET | Freq: Every day | ORAL | Status: DC
Start: 2015-01-31 — End: 2015-02-01

## 2015-01-31 MED ORDER — LISINOPRIL 5 MG PO TABS: 5.0000 mg | ORAL_TABLET | Freq: Every day | ORAL | Status: DC

## 2015-01-31 MED ORDER — QUETIAPINE FUMARATE 25 MG PO TABS
12.5000 mg | ORAL_TABLET | Freq: Two times a day (BID) | ORAL | Status: DC
Start: 2015-01-31 — End: 2015-02-01

## 2015-01-31 NOTE — Progress Notes (Addendum)
PROGRESS NOTE  Victoria FarberJudy Derosa ZOX:[id number] DOB: [birth year] DOA: 01/26/2015 PCP: No primary care provider on file.  Victoria Hubbard is a 70 y.o. female With a history of HTN and seixures [facility] patient presented last night with confusion and seizure activity. Limited history was able to be obtained. Troponin found to be elevated, [facility].  Supposedly on Carbamazepine at home.  Patient lives home alone and is brought to her doctor by neighbors-- no family that her PCP is aware of.   PCP states that she is "mentally challenged"   Assessment/Plan: Seizures. Apparently has history of seizures and is maintained on Tegretol.  -Head CT scan negative per report -Neurology following: Tegretol 400 milligrams by mouth 3 times a day -seizure precautions  Elevated Troponin, probably related to seizure activity but she has T wave inversions on ECG. .  -no cardiology intervention - negative. cycle Troponins.  Hypertension.  -Restart home Zestoretic when awake.   Agitation -seroquel  -psych consult- for capacity as patient will most likely become a ward of the state as not family. - psychiatry suggesting she has capacity to make decisions. Today she appears confused, is not oriented to time or person.  - on further questioning about her home situation, she reports she wants to go home, . Asked her about cooking, she reported that she dosen't cook and dones't go near the stove, and when asked how she is getting meals. She said she asks around, and people? Neighbors give her food. Asked her how she gets her meds and if she is compliant to her meds, she remained blank for a few minutes and said people help her. But she could not provide more information about the people who help her, she does not know their names or phone numbers. When asked about her family, she said they are [place], but once again she does not know about their  names or phone numbers.   She is a high risk for readmissions, we are not sure if she is compliant to her medications,  and we haven't been able to get in touch with family all day yesterday.  Called PCP office and they do not have any phone numbers or contact numbers of the neighbors or family members.  They have reported that different people get her to appointments .  i do not feel comfortable discharging her home to herself without getting more information about her living situation.   Elevated CK -trending down  Called # listed and was not a valid number -also called PCP to get baseline-- patient is brought to appointments by "friend/neighbor", no family that PCP is aware of.  Patient is soft spoken and is "mentally challenged"-- social work consult -will get re eval from psychiatry for capacity and get neurology to see if she has early dementia setting in.  MRI brain does show age related cerebral atrophy with several small remote lacunar infarcts, which might contribute to her memory deficits and early dementia. Will request neurology to reevaluate her in am.       Code Status: full Family Communication: patient Disposition Plan: SNF? Vs home in the next 24 hours.    Consultants:  Neuro  Cards  Psychiatry.  Procedures:  none   HPI/Subjective: Confused.   Objective: Filed Vitals:   01/31/15 1401 01/31/15 1552  BP: 131/47   Pulse: 77 85  Temp:    Resp: 20     Intake/Output Summary (Last 24 hours)  at 01/31/15 1818 Last data filed at 01/31/15 1300  Gross per 24 hour  Intake    360 ml  Output      0 ml  Net    360 ml   Filed Weights   01/26/15 1023  Weight: 50.4 kg (111 lb 1.8 oz)    Exam:   General:  Awake, soft spoken  Cardiovascular: rrr  Respiratory: clear  Abdomen: +BS, soft  Musculoskeletal: no edema   Data Reviewed: Basic Metabolic Panel:  Recent Labs Lab 01/26/15 1239 01/27/15 0645 01/28/15 0444 01/29/15 0728  NA  --  142  142 141  K  --  3.9 3.6 3.7  CL  --  111 109 103  CO2  --  22 23 26   GLUCOSE  --  61* 100* 95  BUN  --  15 10 10   CREATININE 0.97 1.10* 0.90 0.92  CALCIUM  --  9.9 9.6 9.6   Liver Function Tests:  Recent Labs Lab 01/27/15 0645  AST 42*  ALT 18  ALKPHOS 70  BILITOT 0.7  PROT 6.1*  ALBUMIN 3.0*   No results for input(s): LIPASE, AMYLASE in the last 168 hours. No results for input(s): AMMONIA in the last 168 hours. CBC:  Recent Labs Lab 01/26/15 1239 01/27/15 0645 01/28/15 0444 01/29/15 0728  WBC 8.0 7.6 7.2 4.2  HGB 9.8* 11.0* 10.5* 10.9*  HCT 29.6* 34.0* 32.0* 33.1*  MCV 95.2 95.2 93.8 94.3  PLT 154 176 166 161   Cardiac Enzymes:  Recent Labs Lab 01/26/15 1239 01/26/15 1916 01/27/15 01/27/15 0930 01/29/15 0728  CKTOTAL  --   --   --  1447* 867*  TROPONINI 0.10* 0.08* 0.07*  --   --    BNP (last 3 results) No results for input(s): BNP in the last 8760 hours.  ProBNP (last 3 results) No results for input(s): PROBNP in the last 8760 hours.  CBG: No results for input(s): GLUCAP in the last 168 hours.  Recent Results (from the past 240 hour(s))  MRSA PCR Screening     Status: None   Collection Time: 01/26/15 10:40 AM  Result Value Ref Range Status   MRSA by PCR NEGATIVE NEGATIVE Final    Comment:        The GeneXpert MRSA Assay (FDA approved for NASAL specimens only), is one component of a comprehensive MRSA colonization surveillance program. It is not intended to diagnose MRSA infection nor to guide or monitor treatment for MRSA infections.      Studies: Mr Brain Wo Contrast  01/30/2015  CLINICAL DATA:  Initial evaluation for recurrent generalized seizure and altered mental status. EXAM: MRI HEAD WITHOUT CONTRAST TECHNIQUE: Multiplanar, multiecho pulse sequences of the brain and surrounding structures were obtained without intravenous contrast. COMPARISON:  None. FINDINGS: Study somewhat limited by patient positioning and motion artifact. Mild  diffuse prominence of the CSF containing spaces is compatible with generalized age-related cerebral atrophy. Very mild patchy T2/FLAIR hyperintensity within the periventricular white matter are noted, most likely related to chronic small vessel ischemic disease. Remote lacunar infarct within the right thalamus, right caudate head, and right putamen. Additional small remote lacunar infarcts within the central pons. No abnormal foci of restricted diffusion to suggest acute intracranial infarct. Gray-white matter differentiation maintained. Normal intravascular flow voids maintained. No acute or chronic intracranial hemorrhage. No mass lesion, midline shift, or mass effect. Hippocampi are grossly symmetric with normal morphology and signal intensity, although evaluation somewhat limited due to patient positioning. Craniocervical junction within  normal limits. Degenerative spondylolysis noted within the visualized upper cervical spine with associated moderate canal stenosis. Pituitary gland within normal limits. No acute abnormality about the orbits. Paranasal sinuses are clear. No significant mastoid effusion. Inner ear structures grossly normal. Bone marrow signal intensity within normal limits. No scalp soft tissue abnormality. IMPRESSION: 1. No acute intracranial process identified. 2. 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 3. Age-related cerebral atrophy with mild chronic small vessel ischemic disease. Electronically Signed   By: Rise Mu M.D.   On: 01/30/2015 05:40    Scheduled Meds: . carbamazepine  400 mg Oral BH-q7a  . carbamazepine  600 mg Oral QHS  . enoxaparin (LOVENOX) injection  40 mg Subcutaneous Q24H  . lisinopril  5 mg Oral Daily  . QUEtiapine  12.5 mg Oral BID  . QUEtiapine  25 mg Oral QHS   Continuous Infusions:  Antibiotics Given (last 72 hours)    None      Principal Problem:   Seizures (HCC) Active  Problems:   Essential hypertension   Nonspecific abnormal electrocardiogram (ECG) (EKG)   Elevated troponin level   Altered mental status   Agitation   Acute delirium    Time spent: 25 min    Kearstin Learn  [facility] Pager [phone] If 7PM-7AM, please contact night-coverage at [email or web address], password [facility] 01/31/2015, 6:18 PM  LOS: 5 days

## 2015-01-31 NOTE — Care Management Note (Addendum)
Case Management Note  Patient Details  Name: Victoria Hubbard MRN: [id number] Date of Birth: 03/16/1945  Subjective/Objective:  Patient is refusing snf, she lives alone and has no family around to help her.  NCM called her PCP office and they state they do not have any family members name on file for patient, but a neighbor named Earvin HansenGerald usually brings her to the PCP office and drops her off, but they could not give me his phone number.  They stated Adelene IdlerCharlotte [place] is listed on patient's form who is a friend of patient's and her phone is [phone].  NCM called Claris GowerCharlotte but the only thing she was able to tell NCM is that patient does everything on her own and she really does not know much.  NCM [facility] and spoke with rep in ED, they stated the reason the patient came to [facility] is because [facility] not have Neurology [facility] and [facility] was on Diversion (no beds), so patient was transferred here.  Patient will need to be seen  By Psych and Neuro to determine if there is some underlying Dementia or Cognitive issues with patient, this was relayed to attending,requested by Physician advisor.  [place] in [place]  [place] call from [facility] stating that they would not be able to take patient due to staffing .  CSW will try to contact landlord and neighbor to see if she can find out patient's brother name and phone number.                   Action/Plan:   Expected Discharge Date:                  Expected Discharge Plan:  Skilled Nursing Facility  In-House Referral:     Discharge planning Services  CM Consult  Post Acute Care Choice:    Choice offered to:     DME Arranged:    DME Agency:     HH Arranged:    HH Agency:     Status of Service:  In process, will continue to follow  Medicare Important Message Given:  Yes Date Medicare IM Given:    Medicare IM give by:    Date Additional Medicare IM Given:     Additional Medicare Important Message give by:     If discussed at Long Length of Stay Meetings, dates discussed:    Additional Comments:  Leone Havenaylor, Aleeyah Bensen [place], RN 01/31/2015, 5:44 PM

## 2015-01-31 NOTE — Progress Notes (Signed)
Per MD, patient reported to have capacity to make decisions. CSW spoke w/ patient regarding recommendation of SNF placement at discharge. Patient stated that she wants to go home and that she has people in the neighborhood to help her at home. Patient stated that she came in with cash and will need a cab ride home.  Patient refusing SNF.  CSW signing off.  Osborne Cascoadia Liat Mayol LCSWA [phone]

## 2015-01-31 NOTE — Progress Notes (Signed)
Occupational Therapy Treatment Patient Details Name: Victoria FarberJudy Hubbard MRN: [id number] DOB: May 04, 1945 Today's Date: 01/31/2015    History of present illness Victoria Hubbard is an 70 y.o. female hx of seizures admitted with AMS   OT comments  Pt still with decreased awareness of situation and place.  Demonstrates increased fall risk with toileting and mobility without use of an assistive device as well.  Feel she is still a fall risk if discharged home alone.  Still recommend short term SNF for discharge.  Follow Up Recommendations  SNF    Equipment Recommendations  Other (comment) (TBD depending on discharge environment)    Recommendations for Other Services      Precautions / Restrictions Precautions Precautions: Fall Restrictions Weight Bearing Restrictions: No       Mobility Bed Mobility Overal bed mobility: Independent                Transfers Overall transfer level: Needs assistance     Sit to Stand: Min guard         General transfer comment: min/guard for balance with pt demonstrating increased weaving side to side while ambulating and performing head movements in all directions.     Balance     Sitting balance-Leahy Scale: Good       Standing balance-Leahy Scale: Fair Standing balance comment: Pt with multiple episodes of staggering and LOB during mobility.  Delayed balance reactions as well.                     ADL Overall ADL's : Needs assistance/impaired     Grooming: Supervision/safety;Standing                   Toilet Transfer: Min guard   Toileting- ArchitectClothing Manipulation and Hygiene: Min guard         General ADL Comments: Pt still needing min guard assist for safety secondary to fall risk.                  Cognition   Behavior During Therapy: WFL for tasks assessed/performed Overall Cognitive Status: Impaired/Different from baseline Area of Impairment: Orientation;Memory;Problem solving Orientation Level:  Place;Time   Memory: Decreased short-term memory    Safety/Judgement: Decreased awareness of safety Awareness: Anticipatory Problem Solving: Slow processing;Requires verbal cues General Comments: Pt unable to recall her room number after less than 1 minute delay.  Needed max instructional cueing for locating her room when using external aides.                  Pertinent Vitals/ Pain       Pain Assessment: No/denies pain         Frequency Min 2X/week     Progress Toward Goals  OT Goals(current goals can now be found in the care plan section)  Progress towards OT goals: Progressing toward goals     Plan Discharge plan remains appropriate       End of Session Equipment Utilized During Treatment: Gait belt   Activity Tolerance Patient tolerated treatment well   Patient Left in chair;with call bell/phone within reach;with chair alarm set   Nurse Communication Mobility status        Time: [id number] OT Time Calculation (min): 18 min  Charges: OT General Charges $OT Visit: 1 Procedure OT Treatments $Cognitive Skills Development: 8-22 mins  Delon Revelo OTR/L 01/31/2015, 3:59 PM

## 2015-01-31 NOTE — Progress Notes (Signed)
CSW spoke w/ patient who provided the name of her brother in [place], Victoria Hubbard, but could not provide a phone number. Patient stated all her numbers were in a book in her apartment.  CSW left a message for patient's landlord housing authority, [phone].  Patient's neighbor, Victoria Hubbard [phone]) confirmed the patient's statements about her brother and that the patient was very active in walking around the neighborhood and having neighbors help her do things.  CSW will continue to follow for discharge plans.  Osborne Cascoadia Miosotis Wetsel LCSWA [phone]

## 2015-02-01 DIAGNOSIS — F09 Unspecified mental disorder due to known physiological condition: Secondary | ICD-10-CM

## 2015-02-01 MED ORDER — CARBAMAZEPINE 200 MG PO TABS: 600.0000 mg | ORAL_TABLET | Freq: Every day | ORAL | Status: AC

## 2015-02-01 MED ORDER — QUETIAPINE FUMARATE 25 MG PO TABS: 25.0000 mg | ORAL_TABLET | Freq: Every day | ORAL | Status: AC

## 2015-02-01 MED ORDER — CARBAMAZEPINE 200 MG PO TABS: 400.0000 mg | ORAL_TABLET | ORAL | Status: AC

## 2015-02-01 MED ORDER — LISINOPRIL 5 MG PO TABS: 5.0000 mg | ORAL_TABLET | Freq: Every day | ORAL | Status: AC

## 2015-02-01 MED ORDER — QUETIAPINE FUMARATE 25 MG PO TABS
12.5000 mg | ORAL_TABLET | Freq: Two times a day (BID) | ORAL | Status: AC
Start: 2015-02-01 — End: ?

## 2015-02-01 NOTE — Care Management Note (Signed)
Case Management Note  Patient Details  Name: Victoria Hubbard MRN: [id number] Date of Birth: August 01, 1945  Subjective/Objective:     NCM spoke with patient, she states [facility] who she would like to use, [facility], aide and Child psychotherapistsocial worker.  Soc will begin 24-48 hrs post dc.  NCM faxed referral [facility]nie [facility] phone [phone], fax [phone].               Action/Plan:   Expected Discharge Date:                  Expected Discharge Plan:  Home w Home Health Services  In-House Referral:     Discharge planning Services  CM Consult  Post Acute Care Choice:  Home Health Choice offered to:  Patient  DME Arranged:    DME Agency:     HH Arranged:  RN, PT, Nurse's Aide, Social Work [employer] Agency:  [facility]  Status of Service:  Completed, signed off  Medicare Important Message Given:  Yes Date Medicare IM Given:    Medicare IM give by:    Date Additional Medicare IM Given:    Additional Medicare Important Message give by:     If discussed at Long Length of Stay Meetings, dates discussed:    Additional Comments:  Leone Havenaylor, Romelle Muldoon [place], RN 02/01/2015, 12:25 PM

## 2015-02-01 NOTE — Progress Notes (Signed)
CSW provided patient w/ taxi voucher to get home. CSW called patient's neighbor, Adelene IdlerCharlotte [place], and landlord to let them know patient will be arriving home by taxi.   CSW signing off.  Osborne Cascoadia Lyndzee Kliebert LCSWA [phone]

## 2015-02-01 NOTE — Discharge Summary (Signed)
Physician Discharge Summary  Katherine Tout FAO:[id number] DOB: Feb 07, 1945 DOA: 01/26/2015  PCP: No primary care provider on file.  Admit date: 01/26/2015 Discharge date: 02/01/2015  Time spent: 30 minutes  Recommendations for Outpatient Follow-up:  1. Follow up with PCP in one week.  2. Please follow up with home health RN, PT, HHA AND SW.    Discharge Diagnoses:  Principal Problem:   Seizures (HCC) Active Problems:   Essential hypertension   Nonspecific abnormal electrocardiogram (ECG) (EKG)   Elevated troponin level   Altered mental status   Agitation   Acute delirium   Discharge Condition: improved  Diet recommendation: regular diet.   Filed Weights   01/26/15 1023  Weight: 50.4 kg (111 lb 1.8 oz)    History of present illness:  Victoria Hubbard is a 70 y.o. female With a history of HTN and seixures transferred from [facility] where patient presented with confusion and seizure activity. Troponin found to be elevated, patient transferred to [facility] for further evaluation. Supposedly on Carbamazepine at home. Patient lives home alone and is brought to her doctor by neighbors-- no family that her PCP is aware of. PCP states that she is "mentally challenged".    Hospital Course:  Seizures. Apparently has history of seizures and is maintained on Tegretol.  -Head CT scan negative per report -Neurology following: Tegretol 400 milligrams by mouth 3 times a day -seizure precautions  Elevated Troponin, probably related to seizure activity but she has T wave inversions on ECG. .  -no cardiology intervention - negative. cycle Troponins.  Hypertension.  -Restart home Zestoretic when awake.   Agitation -seroquel  -psych consult- for capacity as patient will most likely become a ward of the state as not family. - psychiatry suggesting she has capacity to make decisions. Today she appears confused, is not oriented to time or person.   Procedures:  MRI  BRAIN.  Consultations:  neurology  Discharge Exam: Filed Vitals:   01/31/15 2129 02/01/15 0619  BP: 150/69 177/70  Pulse: 62 68  Temp: 98.6 F (37 C) 98.2 F (36.8 C)  Resp:  18    General: alert afebrile comfortable Cardiovascular: s1s2 Respiratory: ctab  Discharge Instructions    Current Discharge Medication List    START taking these medications   Details  lisinopril (PRINIVIL,ZESTRIL) 5 MG tablet Take 1 tablet (5 mg total) by mouth daily. Qty: 30 tablet, Refills: 0    !! QUEtiapine (SEROQUEL) 25 MG tablet Take 0.5 tablets (12.5 mg total) by mouth 2 (two) times daily. Qty: 60 tablet, Refills: 0    !! QUEtiapine (SEROQUEL) 25 MG tablet Take 1 tablet (25 mg total) by mouth at bedtime. Qty: 30 tablet, Refills: 0     !! - Potential duplicate medications found. Please discuss with provider.    CONTINUE these medications which have CHANGED   Details  !! carbamazepine (TEGRETOL) 200 MG tablet Take 2 tablets (400 mg total) by mouth every morning. Qty: 30 tablet, Refills: 0    !! carbamazepine (TEGRETOL) 200 MG tablet Take 3 tablets (600 mg total) by mouth at bedtime. Qty: 30 tablet, Refills: 0     !! - Potential duplicate medications found. Please discuss with provider.    CONTINUE these medications which have NOT CHANGED   Details  acetaminophen (TYLENOL) 325 MG tablet Take 650 mg by mouth every 6 (six) hours as needed for mild pain.      STOP taking these medications     topiramate (TOPAMAX) 200 MG tablet  No Known Allergies Follow-up Information    Follow up with [facility]   Specialty:  Home Health Services   Why:  [facility], PT, aide and social worker, [place] phone in [place] is [phone]   Contact information:   [address] [place] [address] [phone]        The results of significant diagnostics from this hospitalization (including imaging, microbiology, ancillary and laboratory) are listed below for  reference.    Significant Diagnostic Studies: Mr Brain Wo Contrast  01/30/2015  CLINICAL DATA:  Initial evaluation for recurrent generalized seizure and altered mental status. EXAM: MRI HEAD WITHOUT CONTRAST TECHNIQUE: Multiplanar, multiecho pulse sequences of the brain and surrounding structures were obtained without intravenous contrast. COMPARISON:  None. FINDINGS: Study somewhat limited by patient positioning and motion artifact. Mild diffuse prominence of the CSF containing spaces is compatible with generalized age-related cerebral atrophy. Very mild patchy T2/FLAIR hyperintensity within the periventricular white matter are noted, most likely related to chronic small vessel ischemic disease. Remote lacunar infarct within the right thalamus, right caudate head, and right putamen. Additional small remote lacunar infarcts within the central pons. No abnormal foci of restricted diffusion to suggest acute intracranial infarct. Gray-white matter differentiation maintained. Normal intravascular flow voids maintained. No acute or chronic intracranial hemorrhage. No mass lesion, midline shift, or mass effect. Hippocampi are grossly symmetric with normal morphology and signal intensity, although evaluation somewhat limited due to patient positioning. Craniocervical junction within normal limits. Degenerative spondylolysis noted within the visualized upper cervical spine with associated moderate canal stenosis. Pituitary gland within normal limits. No acute abnormality about the orbits. Paranasal sinuses are clear. No significant mastoid effusion. Inner ear structures grossly normal. Bone marrow signal intensity within normal limits. No scalp soft tissue abnormality. IMPRESSION: 1. No acute intracranial process identified. 2. 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 3. Age-related cerebral atrophy with mild chronic small vessel ischemic disease.  Electronically Signed   By: Rise Mu M.D.   On: 01/30/2015 05:40    Microbiology: Recent Results (from the past 240 hour(s))  MRSA PCR Screening     Status: None   Collection Time: 01/26/15 10:40 AM  Result Value Ref Range Status   MRSA by PCR NEGATIVE NEGATIVE Final    Comment:        The GeneXpert MRSA Assay (FDA approved for NASAL specimens only), is one component of a comprehensive MRSA colonization surveillance program. It is not intended to diagnose MRSA infection nor to guide or monitor treatment for MRSA infections.      Labs: Basic Metabolic Panel:  Recent Labs Lab 01/26/15 1239 01/27/15 0645 01/28/15 0444 01/29/15 0728  NA  --  142 142 141  K  --  3.9 3.6 3.7  CL  --  111 109 103  CO2  --  22 23 26   GLUCOSE  --  61* 100* 95  BUN  --  15 10 10   CREATININE 0.97 1.10* 0.90 0.92  CALCIUM  --  9.9 9.6 9.6   Liver Function Tests:  Recent Labs Lab 01/27/15 0645  AST 42*  ALT 18  ALKPHOS 70  BILITOT 0.7  PROT 6.1*  ALBUMIN 3.0*   No results for input(s): LIPASE, AMYLASE in the last 168 hours. No results for input(s): AMMONIA in the last 168 hours. CBC:  Recent Labs Lab 01/26/15 1239 01/27/15 0645 01/28/15 0444 01/29/15 0728  WBC 8.0 7.6 7.2 4.2  HGB 9.8* 11.0* 10.5* 10.9*  HCT 29.6* 34.0* 32.0* 33.1*  MCV 95.2 95.2 93.8 94.3  PLT 154 176 166 161   Cardiac Enzymes:  Recent Labs Lab 01/26/15 1239 01/26/15 1916 01/27/15 01/27/15 0930 01/29/15 0728  CKTOTAL  --   --   --  1447* 867*  TROPONINI 0.10* 0.08* 0.07*  --   --    BNP: BNP (last 3 results) No results for input(s): BNP in the last 8760 hours.  ProBNP (last 3 results) No results for input(s): PROBNP in the last 8760 hours.  CBG: No results for input(s): GLUCAP in the last 168 hours.     SignedKathlen Mody:  Marlissa Emerick MD   [facility] 02/01/2015, 12:33 PM

## 2015-02-01 NOTE — Progress Notes (Signed)
NURSING PROGRESS NOTE  Victoria FarberJudy Hubbard [id number] Discharge Data: 02/01/2015 6:19 PM Attending Provider: No att. providers found PCP:No primary care provider on file.     Victoria FarberJudy Hubbard to be D/C'd Home per MD order.  Discussed with the patient the After Visit Summary and all questions fully answered. All IV's discontinued with no bleeding noted. All belongings returned to patient for patient to take home.   Last Vital Signs:  Blood pressure 129/50, pulse 75, temperature 99.1 F (37.3 C), temperature source Oral, resp. rate 20, height 5\' 2"  (1.575 m), weight 50.4 kg (111 lb 1.8 oz), SpO2 100 %.  Discharge Medication List   Medication List    STOP taking these medications        topiramate 200 MG tablet  Commonly known as:  TOPAMAX      TAKE these medications        acetaminophen 325 MG tablet  Commonly known as:  TYLENOL  Take 650 mg by mouth every 6 (six) hours as needed for mild pain.     carbamazepine 200 MG tablet  Commonly known as:  TEGRETOL  Take 2 tablets (400 mg total) by mouth every morning.     carbamazepine 200 MG tablet  Commonly known as:  TEGRETOL  Take 3 tablets (600 mg total) by mouth at bedtime.     lisinopril 5 MG tablet  Commonly known as:  PRINIVIL,ZESTRIL  Take 1 tablet (5 mg total) by mouth daily.     QUEtiapine 25 MG tablet  Commonly known as:  SEROQUEL  Take 1 tablet (25 mg total) by mouth at bedtime.     QUEtiapine 25 MG tablet  Commonly known as:  SEROQUEL  Take 0.5 tablets (12.5 mg total) by mouth 2 (two) times daily.         Roma KayserStephanie Dorien Bessent, RN

## 2015-02-01 NOTE — Progress Notes (Addendum)
Physical Therapy Treatment/DC Note Patient Details Name: Victoria Hubbard MRN: [id number] DOB: 07-03-1945 Today's Date: 02/03/15    History of Present Illness Victoria Hubbard is an 70 y.o. female hx of seizures admitted with AMS    PT Comments    Pt with improved mobility and cognition since last PT visit. Expect pt is very close to baseline and recommend HHPT safety eval when pt returns home.  Follow Up Recommendations  Home health PT;Supervision - Intermittent     Equipment Recommendations  None recommended by PT    Recommendations for Other Services       Precautions / Restrictions Precautions Precautions: Fall Restrictions Weight Bearing Restrictions: No    Mobility  Bed Mobility Overal bed mobility: Independent                Transfers Overall transfer level: Modified independent                  Ambulation/Gait Ambulation/Gait assistance: Modified independent (Device/Increase time) Ambulation Distance (Feet): 600 Feet Assistive device: None Gait Pattern/deviations: Step-through pattern;Drifts right/left     General Gait Details: Some drift with gait but no loss of balance. Pt able to perform head turns, 180 degree turn,  360 degrees turn and pick object up from floor without loss of balance.   Stairs Stairs: Yes Stairs assistance: Supervision Stair Management: One rail Right;Alternating pattern;Forwards Number of Stairs: 4    Wheelchair Mobility    Modified Rankin (Stroke Patients Only)       Balance   Sitting-balance support: No upper extremity supported Sitting balance-Leahy Scale: Normal     Standing balance support: No upper extremity supported;During functional activity Standing balance-Leahy Scale: Good                      Cognition Arousal/Alertness: Awake/alert Behavior During Therapy: WFL for tasks assessed/performed Overall Cognitive Status: No family/caregiver present to determine baseline cognitive functioning                  General Comments: Per notes primary care physician pt is a little "slow".    Exercises      General Comments        Pertinent Vitals/Pain Pain Assessment: No/denies pain    Home Living                      Prior Function            PT Goals (current goals can now be found in the care plan section) Progress towards PT goals: Goals met/education completed, patient discharged from PT    Frequency       PT Plan Discharge plan needs to be updated    Co-evaluation             End of Session   Activity Tolerance: Patient tolerated treatment well Patient left: in bed;with call bell/phone within reach;with bed alarm set     Time: 5809-9833 PT Time Calculation (min) (ACUTE ONLY): 8 min  Charges:  $Gait Training: 8-22 mins                    G Codes:      Victoria Hubbard 02-03-15, 12:27 PM [employer] PT [phone]

## 2015-02-01 NOTE — Consult Note (Signed)
[facility] Face-to-Face Psychiatry Consult   Reason for Consult:  Capacity evaluation Referring Physician:  Dr. Benjamine Mola Patient Identification: Victoria Hubbard MRN:  [id number] Principal Diagnosis: Seizures [facility]) Diagnosis:   Patient Active Problem List   Diagnosis Date Noted  . Agitation [R45.1]   . Acute delirium [R41.0]   . Altered mental status [R41.82]   . Seizures (HCC) [R56.9] 01/26/2015  . Essential hypertension [I10] 01/26/2015  . Nonspecific abnormal electrocardiogram (ECG) (EKG) [R94.31] 01/26/2015  . Elevated troponin level [R79.89] 01/26/2015    Total Time spent with patient: 1 hour  Subjective:   Victoria Hubbard is a 70 y.o. female patient admitted with seizures.  HPI: Victoria Hubbard is a 70 y.o. femaleseen face-to-face for psychiatric consultation and evaluation of capacity to make her own medical decisions and living arrangements. Patient admitted to [facility] for increased episodes seizures and confusion from [facility]. Patient appeared sitting in a chair next to her bed, calm and cooperative. Patient is awake, alert and oriented to herself and being in hospital during my evaluation. Patient has significant cognitive difficulties, and memory problems. Patient is very clear in her mind that she has problem with seizures and she needed a medication management and also participate outpatient medication treatment with primary care physician. Patient also knows he lives in [place], all by herself and gets help from the neighbors from time to time to go to grocery stores and also for further medical doctor's appointments. Case managers and hospital social services able to  reach neighbors and conformed the information provided by the patient. Unfortunately not able to identify family members who lives in different cities. Patient was offered out-of-home placement including skilled nursing facility or assisted living facility but patient wishes she want to go home and stay in  her surroundings and the familiar places. Patient appeared to be slow responder and sometimes mumble with some words.   Patient has no irritability, agitation, aggressive behavior, or combativeness Patient has cognitive  Limitation but at the same time has knowledge about her current medical condition, required treatment and need of medication management. Patient reportedly compliant with her medication and has no adverse affects. Patient is willing to follow up with primary care physician and compliant with her medication management upon discharge from the hospital. Patient reported she can call one of friends who can provide transportation but unfortunately not able to provide phone numbers during my evaluation.     Past Psychiatric History: none reported  Risk to Self: Is patient at risk for suicide?: No Risk to Others:   Prior Inpatient Therapy:   Prior Outpatient Therapy:    Past Medical History:  Past Medical History  Diagnosis Date  . Seizures (HCC)   . Hypertension    No past surgical history on file. Family History: No family history on file. Family Psychiatric  History: Patient lives in the home by herself and has few friends helping her surroundings.  Social History:  History  Alcohol Use: Not on file     History  Drug Use Not on file    Social History   Social History  . Marital Status: Single    Spouse Name: N/A  . Number of Children: N/A  . Years of Education: N/A   Social History Main Topics  . Smoking status: Current Some Day Smoker -- 1.00 packs/day    Types: Cigarettes  . Smokeless tobacco: Current User    Types: Snuff  . Alcohol Use: Not on file  . Drug Use:  Not on file  . Sexual Activity: Not on file   Other Topics Concern  . Not on file   Social History Narrative   Additional Social History:                          Allergies:  No Known Allergies  Labs:  No results found for this or any previous visit (from the past 48  hour(s)).  Current Facility-Administered Medications  Medication Dose Route Frequency Provider Last Rate Last Dose  . acetaminophen (TYLENOL) tablet 650 mg  650 mg Oral Q6H PRN Meredeth IdeGagan S Lama, MD       Or  . acetaminophen (TYLENOL) suppository 650 mg  650 mg Rectal Q6H PRN Meredeth IdeGagan S Lama, MD      . carbamazepine (TEGRETOL) tablet 400 mg  400 mg Oral BH-q7a Charles Stewart   400 mg at 02/01/15 [id number]  . carbamazepine (TEGRETOL) tablet 600 mg  600 mg Oral QHS Charles Stewart   600 mg at 01/31/15 2156  . enoxaparin (LOVENOX) injection 40 mg  40 mg Subcutaneous Q24H Meredeth IdeGagan S Lama, MD   40 mg at 02/01/15 1159  . lisinopril (PRINIVIL,ZESTRIL) tablet 5 mg  5 mg Oral Daily Joseph ArtJessica U Vann, DO   5 mg at 02/01/15 0907  . LORazepam (ATIVAN) injection 2 mg  2 mg Intravenous Q4H PRN Meredeth IdeGagan S Lama, MD   2 mg at 01/27/15 2330  . ondansetron (ZOFRAN) tablet 4 mg  4 mg Oral Q6H PRN Meredeth IdeGagan S Lama, MD       Or  . ondansetron (ZOFRAN) injection 4 mg  4 mg Intravenous Q6H PRN Meredeth IdeGagan S Lama, MD      . QUEtiapine (SEROQUEL) tablet 12.5 mg  12.5 mg Oral BID Ram Daniel NonesNarayan Kaveer Nandigam, MD   12.5 mg at 02/01/15 1159  . QUEtiapine (SEROQUEL) tablet 25 mg  25 mg Oral QHS Joseph ArtJessica U Vann, DO   25 mg at 01/31/15 2156    Musculoskeletal: Strength & Muscle Tone: decreased Gait & Station: unable to stand Patient leans: N/A  Psychiatric Specialty Exam: ROS  Blood pressure 177/70, pulse 68, temperature 98.2 F (36.8 C), temperature source Oral, resp. rate 18, height 5\' 2"  (1.575 m), weight 50.4 kg (111 lb 1.8 oz), SpO2 100 %.Body mass index is 20.32 kg/(m^2).  General Appearance: Casual  Eye Contact::  Good  Speech:  Slow  Volume:  Decreased  Mood:  Euthymic  Affect:  Constricted and Depressed  Thought Process:  Coherent and Goal Directed  Orientation:  Other:  oriented to her name, place and person.  Thought Content:  WDL  Suicidal Thoughts:  No  Homicidal Thoughts:  No  Memory:  Immediate;   Fair Recent;   Fair   Judgement:  Fair  Insight:  Fair  Psychomotor Activity:  Decreased  Concentration:  Fair  Recall:  [employer]  Fund of Knowledge:Fair  Language: Fair  Akathisia:  Negative  Handed:  Right  AIMS (if indicated):     Assets:  Communication Skills Desire for Improvement Financial Resources/Insurance Housing Leisure Time Resilience  ADL's:  Impaired  Cognition: Impaired,  Mild  Sleep:      Treatment Plan Summary: Daily contact with patient to assess and evaluate symptoms and progress in treatment and Medication management  Patient has capacity to make her own medical decisions and living arrangements based on my evaluation today Continue current medication management without changes Patient is a problem with intellectual deficiencies but not  appear to be dementia  Patient benefit from skilled nursing facility with rehabilitation services. Appreciate psychiatric consultation and we sign off at this time Please contact [phone] or [phone] if needs further assistance   Disposition: Patient does not meet criteria for psychiatric inpatient admission. Supportive therapy provided about ongoing stressors.  Jazier Mcglamery,JANARDHAHA R. 02/01/2015 12:03 PM

## 2016-11-13 IMAGING — MR MR HEAD W/O CM
11 of 13 series · 38 of 48 positions shown · non-contrast
Comparison: None.

CLINICAL DATA: Initial evaluation for recurrent generalized seizure
and altered mental status.

EXAM:
MRI HEAD WITHOUT CONTRAST
TECHNIQUE: Multiplanar, multiecho pulse sequences of the brain and surrounding
structures were obtained without intravenous contrast.

[Series 3: DWI · axial · 3.0mm · 1.09mm/px · z∈[-57,+72]mm · 8 of 90 slices shown (1 of 6)]
[im 1/90]
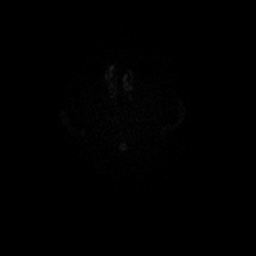
[im 13/90]
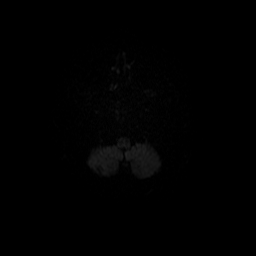
[im 26/90]
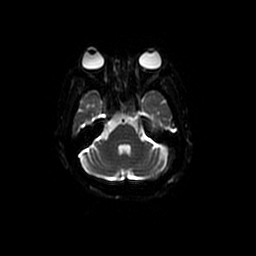
[im 39/90]
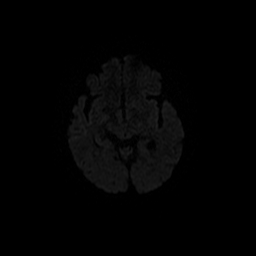
[im 51/90]
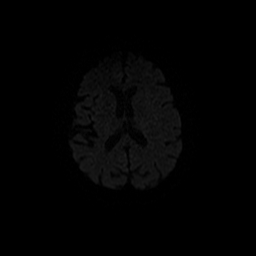
[im 64/90]
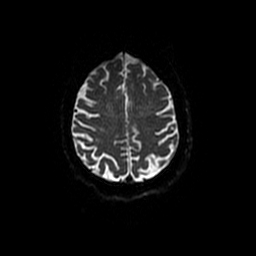
[im 77/90]
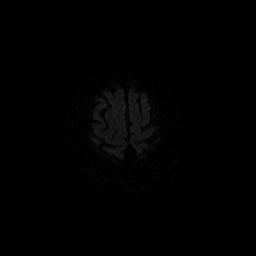
[im 90/90]
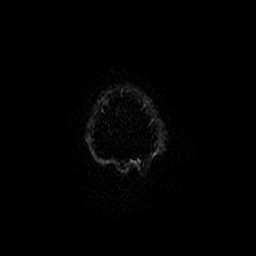

[Series 4: T1 · sagittal · 5.0mm · 0.47mm/px · 2 of 23 slices shown]
[im 1/23]
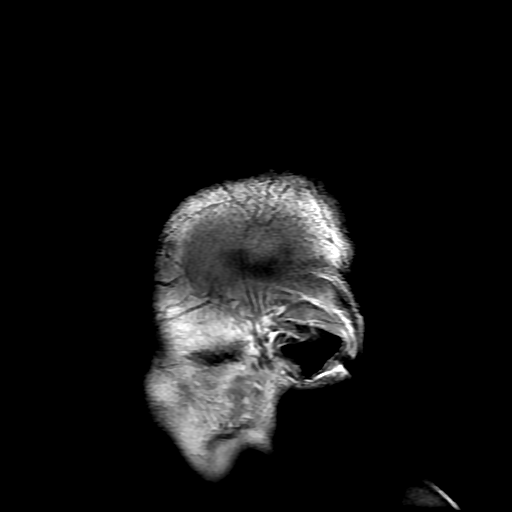
[im 23/23]
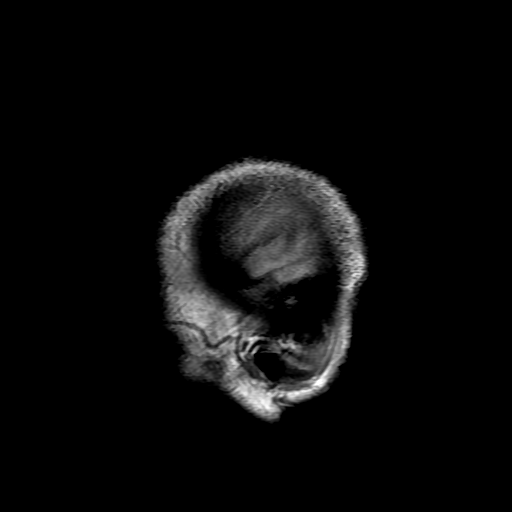

[Series 5: T2 · axial · 5.0mm · 0.43mm/px · z∈[-46,+91]mm · 2 of 24 slices shown (1 of 3)]
[im 1/24]
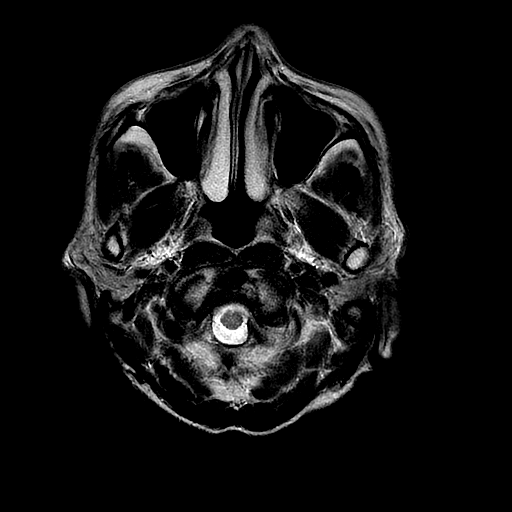
[im 24/24]
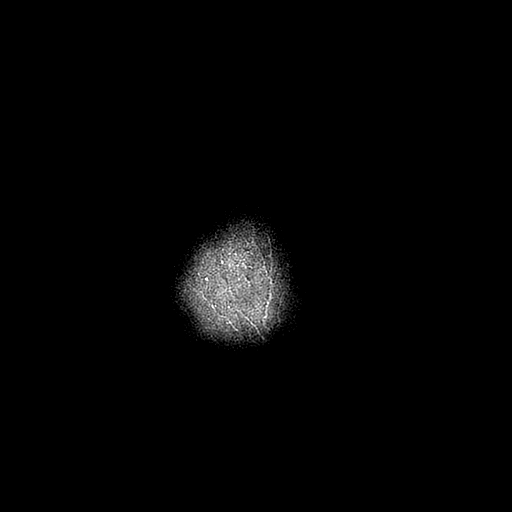

[Series 6: FLAIR · axial · 5.0mm · 0.43mm/px · z∈[-46,+91]mm · 2 of 24 slices shown]
[im 1/24]
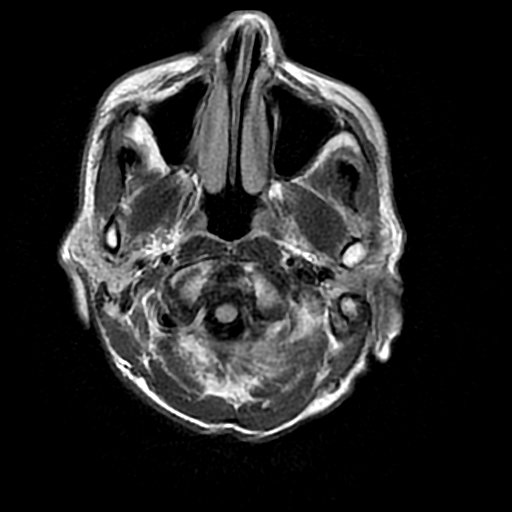
[im 24/24]
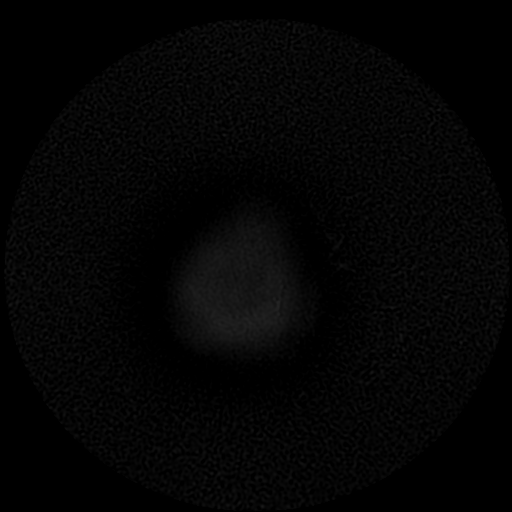

[Series 7: DWI · coronal · 5.0mm · 1.09mm/px · 5 of 66 slices shown (2 of 6)]
[im 1/66]
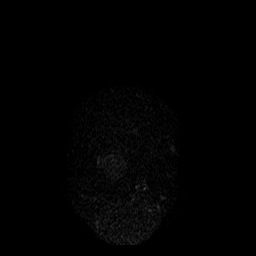
[im 17/66]
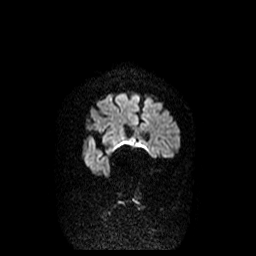
[im 33/66]
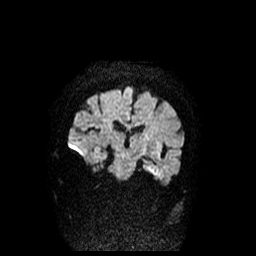
[im 49/66]
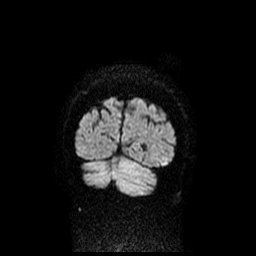
[im 66/66]
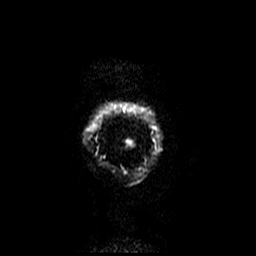

[Series 11: T2 · coronal · 5.0mm · 0.43mm/px · 2 of 27 slices shown (2 of 3)]
[im 1/27]
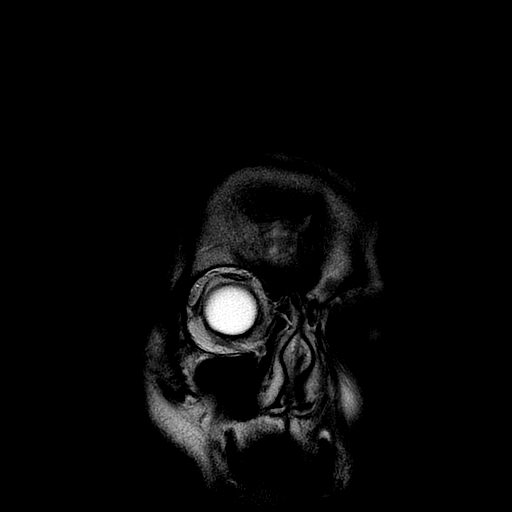
[im 27/27]
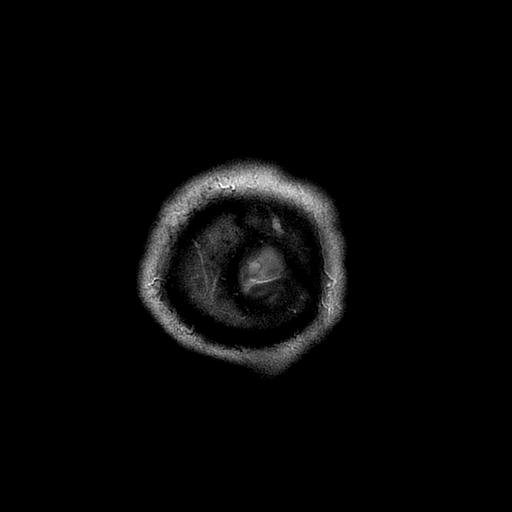

[Series 12: T2 · coronal · 3.0mm · 0.35mm/px · 2 of 27 slices shown (3 of 3)]
[im 1/27]
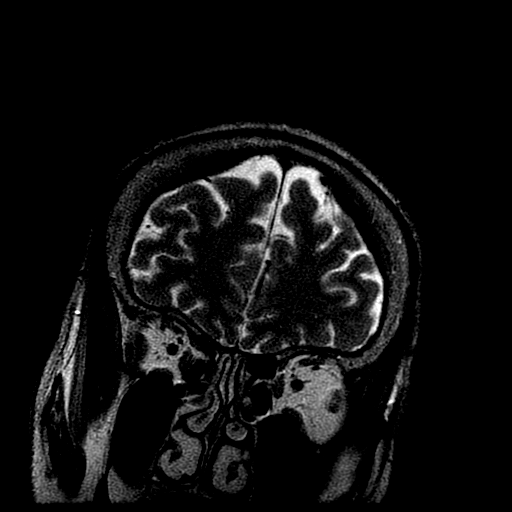
[im 27/27]
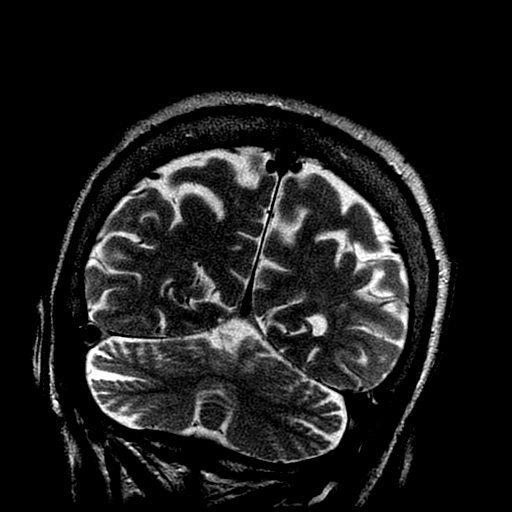

[Series 14: DWI · coronal · 5.0mm · 1.09mm/px · 5 of 66 slices shown (3 of 6)]
[im 1/66]
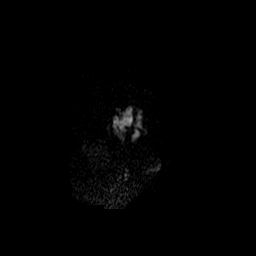
[im 17/66]
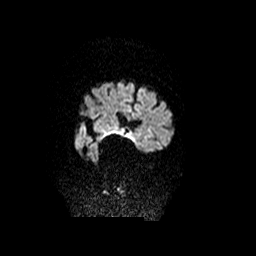
[im 33/66]
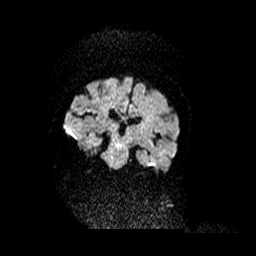
[im 49/66]
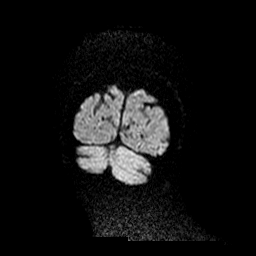
[im 66/66]
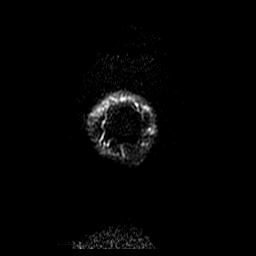

[Series 300: DWI · axial · 3.0mm · 1.09mm/px · z∈[-57,+72]mm · 4 of 45 slices shown (4 of 6)]
[im 1/45]
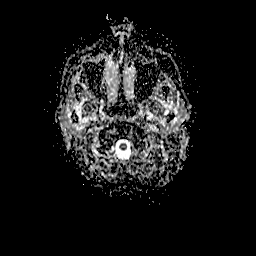
[im 15/45]
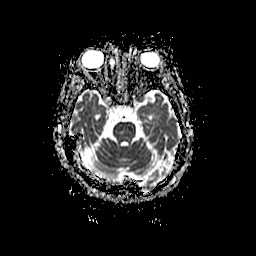
[im 30/45]
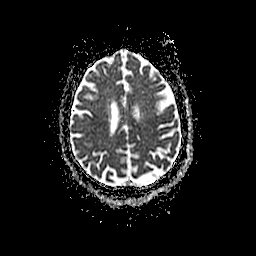
[im 45/45]
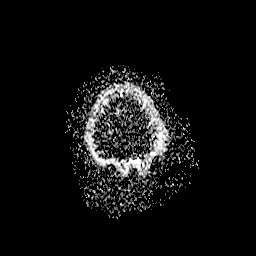

[Series 700: DWI · coronal · 5.0mm · 1.09mm/px · 3 of 33 slices shown (5 of 6)]
[im 1/33]
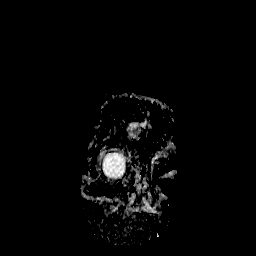
[im 17/33]
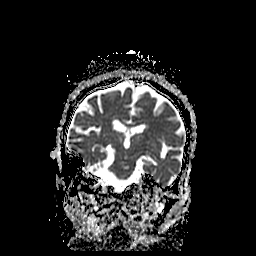
[im 33/33]
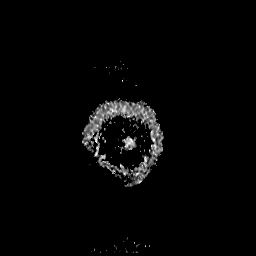

[Series 1400: DWI · coronal · 5.0mm · 1.09mm/px · 3 of 33 slices shown (6 of 6)]
[im 1/33]
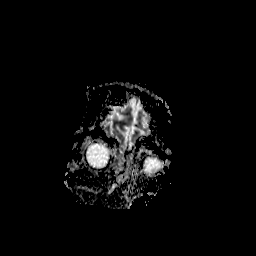
[im 17/33]
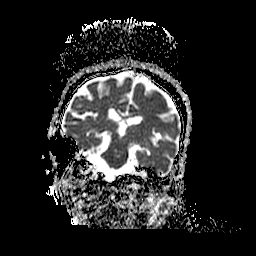
[im 33/33]
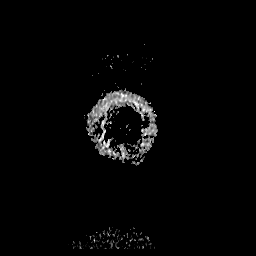

[38 of 48 positions shown; findings below may reference images not displayed]

FINDINGS: Study somewhat limited by patient positioning and motion artifact.

Mild diffuse prominence of the CSF containing spaces is compatible
with generalized age-related cerebral atrophy. Very mild patchy
T2/FLAIR hyperintensity within the periventricular white matter are
noted, most likely related to chronic small vessel ischemic disease.

Remote lacunar infarct within the right thalamus, right caudate
head, and right putamen. Additional small remote lacunar infarcts
within the central pons.

No abnormal foci of restricted diffusion to suggest acute
intracranial infarct. Gray-white matter differentiation maintained.
Normal intravascular flow voids maintained. No acute or chronic
intracranial hemorrhage.

No mass lesion, midline shift, or mass effect. Hippocampi are
grossly symmetric with normal morphology and signal intensity,
although evaluation somewhat limited due to patient positioning.

Craniocervical junction within normal limits. Degenerative
spondylolysis noted within the visualized upper cervical spine with
associated moderate canal stenosis.

Pituitary gland within normal limits. No acute abnormality about the
orbits.

Paranasal sinuses are clear. No significant mastoid effusion. Inner
ear structures grossly normal.

Bone marrow signal intensity within normal limits. No scalp soft
tissue abnormality.
IMPRESSION: 1. No acute intracranial process identified.
2. Several small remote lacunar infarcts involving the right
thalamus, right basal ganglia, and central pons.
3. Age-related cerebral atrophy with mild chronic small vessel
ischemic disease.

## 2021-07-27 DEATH — deceased
